# Patient Record
Sex: Male | Born: 1935 | Race: White | Hispanic: No | Marital: Married | Smoking: Current every day smoker
Health system: Southern US, Community
[De-identification: ages and names within clinical notes are randomized; demographics above are authoritative.]

## PROBLEM LIST (undated history)

## (undated) DIAGNOSIS — N400 Enlarged prostate without lower urinary tract symptoms: Secondary | ICD-10-CM

## (undated) DIAGNOSIS — K4090 Unilateral inguinal hernia, without obstruction or gangrene, not specified as recurrent: Secondary | ICD-10-CM

## (undated) DIAGNOSIS — N200 Calculus of kidney: Secondary | ICD-10-CM

## (undated) DIAGNOSIS — S065X9A Traumatic subdural hemorrhage with loss of consciousness of unspecified duration, initial encounter: Secondary | ICD-10-CM

## (undated) DIAGNOSIS — S32020A Wedge compression fracture of second lumbar vertebra, initial encounter for closed fracture: Secondary | ICD-10-CM

## (undated) DIAGNOSIS — M1990 Unspecified osteoarthritis, unspecified site: Secondary | ICD-10-CM

## (undated) DIAGNOSIS — M479 Spondylosis, unspecified: Secondary | ICD-10-CM

## (undated) DIAGNOSIS — H269 Unspecified cataract: Secondary | ICD-10-CM

---

## 2000-10-26 ENCOUNTER — Encounter: Payer: Self-pay | Admitting: Emergency Medicine

## 2000-10-27 ENCOUNTER — Inpatient Hospital Stay (HOSPITAL_COMMUNITY): Admission: EM | Admit: 2000-10-27 | Discharge: 2000-10-31 | Payer: Self-pay | Admitting: Emergency Medicine

## 2000-10-29 ENCOUNTER — Encounter: Payer: Self-pay | Admitting: Internal Medicine

## 2000-11-06 ENCOUNTER — Ambulatory Visit: Admission: RE | Admit: 2000-11-06 | Discharge: 2000-11-06 | Payer: Self-pay | Admitting: Internal Medicine

## 2000-11-26 ENCOUNTER — Ambulatory Visit (HOSPITAL_COMMUNITY): Admission: RE | Admit: 2000-11-26 | Discharge: 2000-11-26 | Payer: Self-pay | Admitting: Internal Medicine

## 2000-11-26 ENCOUNTER — Encounter: Payer: Self-pay | Admitting: Internal Medicine

## 2001-01-14 ENCOUNTER — Encounter: Payer: Self-pay | Admitting: Internal Medicine

## 2001-01-14 ENCOUNTER — Ambulatory Visit (HOSPITAL_COMMUNITY): Admission: RE | Admit: 2001-01-14 | Discharge: 2001-01-14 | Payer: Self-pay | Admitting: Internal Medicine

## 2004-08-02 ENCOUNTER — Emergency Department (HOSPITAL_COMMUNITY): Admission: EM | Admit: 2004-08-02 | Discharge: 2004-08-02 | Payer: Self-pay | Admitting: Emergency Medicine

## 2004-12-09 ENCOUNTER — Encounter: Admission: RE | Admit: 2004-12-09 | Discharge: 2004-12-09 | Payer: Self-pay | Admitting: Orthopaedic Surgery

## 2004-12-23 ENCOUNTER — Encounter: Admission: RE | Admit: 2004-12-23 | Discharge: 2004-12-23 | Payer: Self-pay | Admitting: Orthopaedic Surgery

## 2005-01-05 ENCOUNTER — Encounter: Admission: RE | Admit: 2005-01-05 | Discharge: 2005-01-05 | Payer: Self-pay | Admitting: Orthopaedic Surgery

## 2005-01-18 ENCOUNTER — Ambulatory Visit (HOSPITAL_COMMUNITY): Admission: RE | Admit: 2005-01-18 | Discharge: 2005-01-18 | Payer: Self-pay | Admitting: Neurology

## 2005-01-26 ENCOUNTER — Ambulatory Visit (HOSPITAL_COMMUNITY): Admission: RE | Admit: 2005-01-26 | Discharge: 2005-01-26 | Payer: Self-pay | Admitting: Neurology

## 2005-03-31 ENCOUNTER — Ambulatory Visit (HOSPITAL_COMMUNITY): Admission: RE | Admit: 2005-03-31 | Discharge: 2005-03-31 | Payer: Self-pay | Admitting: Neurology

## 2005-08-28 ENCOUNTER — Encounter: Admission: RE | Admit: 2005-08-28 | Discharge: 2005-08-28 | Payer: Self-pay | Admitting: Orthopaedic Surgery

## 2005-09-08 ENCOUNTER — Encounter: Admission: RE | Admit: 2005-09-08 | Discharge: 2005-09-08 | Payer: Self-pay | Admitting: Orthopaedic Surgery

## 2005-09-21 ENCOUNTER — Encounter: Admission: RE | Admit: 2005-09-21 | Discharge: 2005-09-21 | Payer: Self-pay | Admitting: Orthopaedic Surgery

## 2005-10-10 ENCOUNTER — Ambulatory Visit (HOSPITAL_COMMUNITY): Admission: RE | Admit: 2005-10-10 | Discharge: 2005-10-10 | Payer: Self-pay | Admitting: Internal Medicine

## 2005-10-13 ENCOUNTER — Encounter: Admission: RE | Admit: 2005-10-13 | Discharge: 2005-10-13 | Payer: Self-pay | Admitting: Internal Medicine

## 2005-12-27 ENCOUNTER — Ambulatory Visit (HOSPITAL_COMMUNITY): Admission: RE | Admit: 2005-12-27 | Discharge: 2005-12-27 | Payer: Self-pay | Admitting: Internal Medicine

## 2005-12-27 ENCOUNTER — Encounter: Payer: Self-pay | Admitting: Cardiology

## 2005-12-27 ENCOUNTER — Ambulatory Visit: Payer: Self-pay | Admitting: Cardiology

## 2007-05-21 ENCOUNTER — Encounter: Admission: RE | Admit: 2007-05-21 | Discharge: 2007-05-21 | Payer: Self-pay | Admitting: Internal Medicine

## 2009-05-07 ENCOUNTER — Encounter: Admission: RE | Admit: 2009-05-07 | Discharge: 2009-07-29 | Payer: Self-pay | Admitting: Physician Assistant

## 2011-04-14 NOTE — Discharge Summary (Signed)
[facility]  Patient:    [name], Mitchell                       MRN: [id number] Adm. Date:  [id number] Disc. Date: [id number] Attending:  Alva Garnet.                           Discharge Summary  DISCHARGE DIAGNOSES: 1.  Pneumonia. 2.  Fever. 3.  Mental status decline. 4.  Debility.  HOSPITAL COURSE:  Mitchell [name] was admitted from the [facility] emergency room with fever and mental status changes.  He had what appeared to be an acute episode where he suddenly was found in a state where he would not walk, had trouble walking up and down steps, and also had confusion.  He stated that his legs "could not work."  Initial chest x-ray revealed a right infrahilar rounded opacity which most likely represented a pneumonia or mass, however, follow-up CT scan did reveal bilateral lower lobe infiltrates which was more pronounced on the right and it was thought to be consistent with pneumonia. Head CT showed moderate atrophy but no acute intracranial findings and he had no other acute neurological symptoms.  A neurology consultation was obtained in the emergency room per Dr. Thad Ranger who felt that his neurological status was most likely secondary to an infectious cause rather than a neurological cause.  He had no other concerns including, shortness of breath, chest pain, headache, myalgia, or arthralgia.  He was started on IV antibiotics and over the course of his hospitalization his mental status returned to baseline.  He also ambulated without assistance and his appetite improved as well. He was treated with Maxipime and Zithromax.  DISPOSITION UPON DISCHARGE:  Mitchell [name] is ambulating, eating without complications.  His respiratory status is stable with O2 saturations in the high 90s.  He continues to spike temperatures at night, however, he defervesced with Tylenol.  He has no cough or shortness of breath.  He is also returned to his baseline mental  status.  DISCHARGE MEDICATIONS:  Suprax, Biaxin, Tylenol.  He will complete a 14 day antibiotic course.  FOLLOW-UP:  Mitchell [name] will be seen by Dr. Andi Devon on Tuesday, December 11 at 10 a.m.  He will require follow-up chest x-ray and/or CT scan in six weeks. DD:  10/30/00 TD:  10/31/00 Job: [id number]

## 2011-04-14 NOTE — Op Note (Signed)
NAMEENOC, GETTER NO.:  [id number]   MEDICAL RECORD NO.:  [id number]          PATIENT TYPE:  OUT   LOCATION:  [facility]                          FACILITY:  [facility]   PHYSICIAN:  Genene Churn. Love, M.D.    DATE OF BIRTH:  Aug 04, 1925   DATE OF PROCEDURE:  03/31/2005  DATE OF DISCHARGE:                                 OPERATIVE REPORT   CLINICAL INFORMATION:  This patient has a history of gait disorder and  positive LP test with drainage of CSF for suspected normal pressure  hydrocephalus and is being reevaluated for the same diagnosis.   PROCEDURE NOTE:  The patient was prepped and draped in the usual manner with  Betadine and 1% Xylocaine. The L4-L5 interspace was entered without  difficulty. Opening pressure was 140 mm of water, and clear, colorless CSF  was removed; 57 cc total was removed. The needle was kept in approximately  31 minutes. The patient performed a timed stand/walk/sit test pre-LP with  two chairs 20 feet apart in 1 minute and 40 seconds. He then performed a  stand/walk/sit test post-LP with chairs 20 steps apart which was 1 minute  and 30 seconds.   IMPRESSION:  This is a possible positive LP drainage procedure and not as  impressive as the first procedure that was performed approximately six weeks  ago.      JML/MEDQ  D:  03/31/2005  T:  03/31/2005  Job:  [id number]   cc:   Casilda Carls  [facility] [place]  [place] [address]  Fax: [phone]   Claude Manges. Cleophas Dunker, M.D.  [address]  [place] [address]  Fax: [phone]

## 2011-04-14 NOTE — H&P (Signed)
[facility]  Patient:    Mitchell [name], Mitchell [name]                         MRN: [id number] Adm. Date:  10/27/00 Attending:  Merlene Laughter. Renae Gloss, M.D.                         History and Physical  CHIEF COMPLAINT:  Altered mental status, pneumonia.  HISTORY OF PRESENT ILLNESS:  Mitchell [name] is a 77 year old gentleman in his usual state of health until 24 hours ago when his wife noted an acute decline in mental status consisting of confusion and disorientation, and difficulty walking.  Mitchell [name] stated that he could not walk and was unable to ambulate in the home.  He had been treated for URI/bronchitiis last week at an urgent care with avelox, however, appeared to have some confusion with that medication which was apparently discontinued and several days ago he was started on Augmentin.  He continues to be febrile.  He denied chest pain, shortness of breath, GI/GU symptoms.  He has acute constitutional specific complaints at this time.  ALLERGIES:  No known drug allergies, however, he may have a questionable reaction to avelox.  MEDICATIONS:  Augmentin.  PAST MEDICAL HISTORY:  None.  FAMILY HISTORY:  Unknown cancer in father who died at age 79.  SOCIAL HISTORY:  Mitchell [name] is married and retired.  He quit smoking 30 years ago.  He denies alcohol or drugs of abuse.  REVIEW OF SYSTEMS:  Negative except as per HPI.  Greater than 10 systems were reviewed.  PHYSICAL EXAMINATION:  GENERAL APPEARANCE:  Well-developed, well-nourished white male in no acute distress.  VITAL SIGNS:  Blood pressure 126/72, pulse 110, respiratory 24.  Temperature 100.1.  O2 saturations on room air 94%.  HEENT:  Dry mucous membranes.  No OP lesions.  PERRLA, EOMI.  NECK:  Supple.  No masses.  2+ carotids, no bruits.  LUNGS:  Decreased breath sounds in the bases bilaterally, otherwise clear to auscultation.  HEART:  S1, S2.  Regular rate and rhythm.  No murmurs, rubs or  gallops.  ABDOMEN:  Soft, nontender, nondistended.  Positive bowel sounds.  EXTREMITIES:  No clubbing, cyanosis, or edema.  2+ pulses throughout.  NEUROLOGIC:  Alert and oriented x 3.  Cranial nerves intact.  ASSESSMENT AND PLAN: 1. Altered mental status, gait disorder.  A neurology consult was obtained    earlier this morning which was unremarkable, and no further neurology    recommendations were given/needed at this time as it is thought that his    symptoms are not secondary to stroke.  His CT scan was unremarkable and    showed no acute abnormality.  His gait was not assessed in the ER, however,    he does have normal strength in his lower extremities with my exam this    morning. 2. Pneumonia:  Mitchell [name] appears to have community-aquired pneumonia requiring    IV antibiotics as he has apparently failed outpatient treatment.  It is    most likely that his pneumonia is the cause of his altered mental status as    well as his difficulty walking secondary to weakness and dehydration.  He    will be admitted for further observation/evaluation on IV antibiotics.    Blood cultures are pending at this time. DD:  10/27/00 TD:  10/27/00 Job: [id number]

## 2011-04-14 NOTE — Consult Note (Signed)
[facility]  Patient:    Mitchell [name], Mitchell [name]                       MRN: [id number] Proc. Date: 10/27/00 Adm. Date:  [id number] Disc. Date: [id number] Attending:  Alva Garnet.                          Consultation Report  DATE OF BIRTH:  27-Aug-1925.  REFERRING PHYSICIAN:  [facility].  REASON FOR EVALUATION:  77 years: Confusion and gait problems.  HISTORY OF PRESENT ILLNESS:  This is the initial emergency room consultation evaluation of this 77 year old man with no chronic medical problems who was brought into the emergency room with acute neurological change.  Patient is unable to give any history.  His wife says that he seemed okay until about 5 p.m. tonight, when he just suddenly was found in a state where he "would not walk."  His wife reports that she took his hand and walked him to the dinner table and that he took "baby steps" the whole way.  He ate uneventfully, but he seemed to have trouble walking up and down steps.  Per his wifes report, he has also been confused since that time.  His wife says his "legs just wouldnt work." There were no left/right focalities to this, no problems with his arms, no bowel or bladder problems, no dysarthria or dysphagia.  Per report, he saw his primary physician on Tuesday and was diagnosed with sinusitis and was treated with Avelox.  He took one dose and subsequently became confused and disoriented, went back and this was changed to Augmentin. His mental status had been better until this evening.  His wife denies any previous problems with mental status and indicates that he keeps his check book, does his taxes and drives without problems.  PAST MEDICAL HISTORY:  He denies chronic medical problems.  FAMILY MEDICAL HISTORY:  His mother had Alzheimers disease.  SOCIAL HISTORY:  No tobacco use.  Lives with his wife.  ALLERGIES:  No known drug allergies.  MEDICATION:  Augmentin.  REVIEW OF  SYSTEMS:  Negative except as above.  PHYSICAL EXAMINATION  VITAL SIGNS:  Temperature 100.1, blood pressure 136/69, pulse 114, respirations 16.  GENERAL:  He is alert and in no respiratory distress.  NECK:  Supple without bruit.  CHEST:  Clear to auscultation.  HEART:  Tachycardic and regular.  NEUROLOGIC:  Mental status:  He is alert but confused.  He alerts to vigorous verbal and tactile stimuli and can answer simple questions but starts failing to respond after a few questions and seems to drift off.  He concentrates poorly.  He is unable to relate any history.  He is occasionally oriented to place, [facility], but is not oriented to time.  Cranial nerves:  He is uncooperative with funduscopic and visual field examination.  Pupils are equal and briskly reactive.  Extraocular movements are grossly full.  Face is symmetric and tongue and palate move well.  Motor exam could not be tested formally, although his tone seems normal and he has at least antigravity strength in all extremities.  Sensation:  He withdraws all four extremities vigorously to pain.  Cerebellar:  Finger-to-nose is performed reasonably well. He does have a coarse tremor of the upper extremities, which seems to be related to rigors.  Gait exam is deferred.  Deep tendon reflexes are 1+ in the  upper extremities and 2+ in the lower extremities.  Toes are downgoing.  LABORATORY EXAMINATION:  CBC:  White cells 3.8, hemoglobin 14.9, platelets 137,000, with increased granulocytes.  CMET is normal except for mildly elevated AST and ALT of 58 and 51, respectively.  Coagulations are normal. Urinalysis is normal.  IMAGING STUDY:  CT of the head reveals no acute changes, with moderate atrophy and increased size of the ventricles appropriate to atrophy.  IMPRESSION:  77 man with toxic encephalopathy secondary to infection.  RECOMMENDATIONS 1. Infectious workup including chest x-ray and possibly  blood cultures. 2. Would defer any further neurological workup at this time until his    infectious problem has cleared and then, at that time, can reassess as    necessary. DD:  10/27/00 TD:  10/27/00 Job: [id number]

## 2011-11-29 DIAGNOSIS — N3941 Urge incontinence: Secondary | ICD-10-CM | POA: Diagnosis not present

## 2011-12-20 DIAGNOSIS — N3941 Urge incontinence: Secondary | ICD-10-CM | POA: Diagnosis not present

## 2012-01-18 DIAGNOSIS — R350 Frequency of micturition: Secondary | ICD-10-CM | POA: Diagnosis not present

## 2012-01-18 DIAGNOSIS — N3941 Urge incontinence: Secondary | ICD-10-CM | POA: Diagnosis not present

## 2012-02-07 DIAGNOSIS — N3941 Urge incontinence: Secondary | ICD-10-CM | POA: Diagnosis not present

## 2012-02-07 DIAGNOSIS — R350 Frequency of micturition: Secondary | ICD-10-CM | POA: Diagnosis not present

## 2012-03-06 DIAGNOSIS — N3941 Urge incontinence: Secondary | ICD-10-CM | POA: Diagnosis not present

## 2012-03-27 DIAGNOSIS — N3941 Urge incontinence: Secondary | ICD-10-CM | POA: Diagnosis not present

## 2012-03-27 DIAGNOSIS — R350 Frequency of micturition: Secondary | ICD-10-CM | POA: Diagnosis not present

## 2012-04-17 DIAGNOSIS — N3941 Urge incontinence: Secondary | ICD-10-CM | POA: Diagnosis not present

## 2012-04-17 DIAGNOSIS — R350 Frequency of micturition: Secondary | ICD-10-CM | POA: Diagnosis not present

## 2012-05-08 DIAGNOSIS — N3941 Urge incontinence: Secondary | ICD-10-CM | POA: Diagnosis not present

## 2012-05-29 DIAGNOSIS — N3941 Urge incontinence: Secondary | ICD-10-CM | POA: Diagnosis not present

## 2012-06-19 DIAGNOSIS — N3941 Urge incontinence: Secondary | ICD-10-CM | POA: Diagnosis not present

## 2012-07-08 DIAGNOSIS — R350 Frequency of micturition: Secondary | ICD-10-CM | POA: Diagnosis not present

## 2012-07-08 DIAGNOSIS — N3941 Urge incontinence: Secondary | ICD-10-CM | POA: Diagnosis not present

## 2012-07-31 DIAGNOSIS — R350 Frequency of micturition: Secondary | ICD-10-CM | POA: Diagnosis not present

## 2012-07-31 DIAGNOSIS — N3941 Urge incontinence: Secondary | ICD-10-CM | POA: Diagnosis not present

## 2012-08-21 DIAGNOSIS — R350 Frequency of micturition: Secondary | ICD-10-CM | POA: Diagnosis not present

## 2012-08-21 DIAGNOSIS — N3941 Urge incontinence: Secondary | ICD-10-CM | POA: Diagnosis not present

## 2012-09-11 DIAGNOSIS — R350 Frequency of micturition: Secondary | ICD-10-CM | POA: Diagnosis not present

## 2012-09-11 DIAGNOSIS — N3941 Urge incontinence: Secondary | ICD-10-CM | POA: Diagnosis not present

## 2012-10-02 DIAGNOSIS — N3941 Urge incontinence: Secondary | ICD-10-CM | POA: Diagnosis not present

## 2012-10-02 DIAGNOSIS — R350 Frequency of micturition: Secondary | ICD-10-CM | POA: Diagnosis not present

## 2012-10-21 DIAGNOSIS — L821 Other seborrheic keratosis: Secondary | ICD-10-CM | POA: Diagnosis not present

## 2012-10-21 DIAGNOSIS — Z85828 Personal history of other malignant neoplasm of skin: Secondary | ICD-10-CM | POA: Diagnosis not present

## 2012-10-21 DIAGNOSIS — L570 Actinic keratosis: Secondary | ICD-10-CM | POA: Diagnosis not present

## 2012-10-23 DIAGNOSIS — N3941 Urge incontinence: Secondary | ICD-10-CM | POA: Diagnosis not present

## 2012-10-23 DIAGNOSIS — R350 Frequency of micturition: Secondary | ICD-10-CM | POA: Diagnosis not present

## 2012-10-31 DIAGNOSIS — Z23 Encounter for immunization: Secondary | ICD-10-CM | POA: Diagnosis not present

## 2012-11-08 DIAGNOSIS — R351 Nocturia: Secondary | ICD-10-CM | POA: Diagnosis not present

## 2012-11-08 DIAGNOSIS — N3941 Urge incontinence: Secondary | ICD-10-CM | POA: Diagnosis not present

## 2012-11-13 DIAGNOSIS — N3941 Urge incontinence: Secondary | ICD-10-CM | POA: Diagnosis not present

## 2012-11-13 DIAGNOSIS — R350 Frequency of micturition: Secondary | ICD-10-CM | POA: Diagnosis not present

## 2012-12-04 DIAGNOSIS — N3941 Urge incontinence: Secondary | ICD-10-CM | POA: Diagnosis not present

## 2012-12-04 DIAGNOSIS — R350 Frequency of micturition: Secondary | ICD-10-CM | POA: Diagnosis not present

## 2012-12-06 DIAGNOSIS — M810 Age-related osteoporosis without current pathological fracture: Secondary | ICD-10-CM | POA: Diagnosis not present

## 2012-12-06 DIAGNOSIS — R82998 Other abnormal findings in urine: Secondary | ICD-10-CM | POA: Diagnosis not present

## 2012-12-06 DIAGNOSIS — Z125 Encounter for screening for malignant neoplasm of prostate: Secondary | ICD-10-CM | POA: Diagnosis not present

## 2012-12-06 DIAGNOSIS — F329 Major depressive disorder, single episode, unspecified: Secondary | ICD-10-CM | POA: Diagnosis not present

## 2012-12-06 DIAGNOSIS — Z79899 Other long term (current) drug therapy: Secondary | ICD-10-CM | POA: Diagnosis not present

## 2012-12-13 DIAGNOSIS — R32 Unspecified urinary incontinence: Secondary | ICD-10-CM | POA: Diagnosis not present

## 2012-12-13 DIAGNOSIS — Z0000 Encounter for general adult medical examination without abnormal findings: Secondary | ICD-10-CM | POA: Diagnosis not present

## 2012-12-13 DIAGNOSIS — Z79899 Other long term (current) drug therapy: Secondary | ICD-10-CM | POA: Diagnosis not present

## 2012-12-13 DIAGNOSIS — Z1331 Encounter for screening for depression: Secondary | ICD-10-CM | POA: Diagnosis not present

## 2012-12-13 DIAGNOSIS — M810 Age-related osteoporosis without current pathological fracture: Secondary | ICD-10-CM | POA: Diagnosis not present

## 2012-12-26 DIAGNOSIS — Z982 Presence of cerebrospinal fluid drainage device: Secondary | ICD-10-CM | POA: Diagnosis not present

## 2012-12-26 DIAGNOSIS — G912 (Idiopathic) normal pressure hydrocephalus: Secondary | ICD-10-CM | POA: Diagnosis not present

## 2012-12-30 DIAGNOSIS — G894 Chronic pain syndrome: Secondary | ICD-10-CM | POA: Diagnosis not present

## 2013-01-01 DIAGNOSIS — R350 Frequency of micturition: Secondary | ICD-10-CM | POA: Diagnosis not present

## 2013-01-01 DIAGNOSIS — N3941 Urge incontinence: Secondary | ICD-10-CM | POA: Diagnosis not present

## 2013-01-06 DIAGNOSIS — G894 Chronic pain syndrome: Secondary | ICD-10-CM | POA: Diagnosis not present

## 2013-01-13 DIAGNOSIS — G894 Chronic pain syndrome: Secondary | ICD-10-CM | POA: Diagnosis not present

## 2013-01-17 DIAGNOSIS — G894 Chronic pain syndrome: Secondary | ICD-10-CM | POA: Diagnosis not present

## 2013-01-20 DIAGNOSIS — G894 Chronic pain syndrome: Secondary | ICD-10-CM | POA: Diagnosis not present

## 2013-01-22 DIAGNOSIS — N3941 Urge incontinence: Secondary | ICD-10-CM | POA: Diagnosis not present

## 2013-01-22 DIAGNOSIS — R350 Frequency of micturition: Secondary | ICD-10-CM | POA: Diagnosis not present

## 2013-01-24 DIAGNOSIS — M5137 Other intervertebral disc degeneration, lumbosacral region: Secondary | ICD-10-CM | POA: Diagnosis not present

## 2013-01-27 DIAGNOSIS — M5137 Other intervertebral disc degeneration, lumbosacral region: Secondary | ICD-10-CM | POA: Diagnosis not present

## 2013-02-03 DIAGNOSIS — G894 Chronic pain syndrome: Secondary | ICD-10-CM | POA: Diagnosis not present

## 2013-02-07 DIAGNOSIS — G894 Chronic pain syndrome: Secondary | ICD-10-CM | POA: Diagnosis not present

## 2013-02-11 DIAGNOSIS — G912 (Idiopathic) normal pressure hydrocephalus: Secondary | ICD-10-CM | POA: Diagnosis not present

## 2013-02-12 DIAGNOSIS — N3941 Urge incontinence: Secondary | ICD-10-CM | POA: Diagnosis not present

## 2013-02-14 DIAGNOSIS — G894 Chronic pain syndrome: Secondary | ICD-10-CM | POA: Diagnosis not present

## 2013-02-20 DIAGNOSIS — M1710 Unilateral primary osteoarthritis, unspecified knee: Secondary | ICD-10-CM | POA: Diagnosis not present

## 2013-03-05 DIAGNOSIS — R350 Frequency of micturition: Secondary | ICD-10-CM | POA: Diagnosis not present

## 2013-03-05 DIAGNOSIS — N3941 Urge incontinence: Secondary | ICD-10-CM | POA: Diagnosis not present

## 2013-03-27 DIAGNOSIS — N3941 Urge incontinence: Secondary | ICD-10-CM | POA: Diagnosis not present

## 2013-04-16 DIAGNOSIS — R350 Frequency of micturition: Secondary | ICD-10-CM | POA: Diagnosis not present

## 2013-04-16 DIAGNOSIS — N3941 Urge incontinence: Secondary | ICD-10-CM | POA: Diagnosis not present

## 2013-05-13 DIAGNOSIS — R350 Frequency of micturition: Secondary | ICD-10-CM | POA: Diagnosis not present

## 2013-05-13 DIAGNOSIS — N3941 Urge incontinence: Secondary | ICD-10-CM | POA: Diagnosis not present

## 2013-05-20 DIAGNOSIS — L600 Ingrowing nail: Secondary | ICD-10-CM | POA: Diagnosis not present

## 2013-05-20 DIAGNOSIS — M79609 Pain in unspecified limb: Secondary | ICD-10-CM | POA: Diagnosis not present

## 2013-05-20 DIAGNOSIS — B351 Tinea unguium: Secondary | ICD-10-CM | POA: Diagnosis not present

## 2013-06-11 DIAGNOSIS — R350 Frequency of micturition: Secondary | ICD-10-CM | POA: Diagnosis not present

## 2013-06-11 DIAGNOSIS — N3941 Urge incontinence: Secondary | ICD-10-CM | POA: Diagnosis not present

## 2013-06-20 DIAGNOSIS — L03019 Cellulitis of unspecified finger: Secondary | ICD-10-CM | POA: Diagnosis not present

## 2013-06-24 DIAGNOSIS — C4442 Squamous cell carcinoma of skin of scalp and neck: Secondary | ICD-10-CM | POA: Diagnosis not present

## 2013-06-24 DIAGNOSIS — D485 Neoplasm of uncertain behavior of skin: Secondary | ICD-10-CM | POA: Diagnosis not present

## 2013-07-11 DIAGNOSIS — R350 Frequency of micturition: Secondary | ICD-10-CM | POA: Diagnosis not present

## 2013-07-11 DIAGNOSIS — N3941 Urge incontinence: Secondary | ICD-10-CM | POA: Diagnosis not present

## 2013-07-16 DIAGNOSIS — C4492 Squamous cell carcinoma of skin, unspecified: Secondary | ICD-10-CM | POA: Diagnosis not present

## 2013-07-30 DIAGNOSIS — N3941 Urge incontinence: Secondary | ICD-10-CM | POA: Diagnosis not present

## 2013-07-30 DIAGNOSIS — R350 Frequency of micturition: Secondary | ICD-10-CM | POA: Diagnosis not present

## 2013-08-20 DIAGNOSIS — N3941 Urge incontinence: Secondary | ICD-10-CM | POA: Diagnosis not present

## 2013-08-20 DIAGNOSIS — R350 Frequency of micturition: Secondary | ICD-10-CM | POA: Diagnosis not present

## 2013-08-25 DIAGNOSIS — M79609 Pain in unspecified limb: Secondary | ICD-10-CM | POA: Diagnosis not present

## 2013-08-25 DIAGNOSIS — B351 Tinea unguium: Secondary | ICD-10-CM | POA: Diagnosis not present

## 2013-09-10 DIAGNOSIS — R3915 Urgency of urination: Secondary | ICD-10-CM | POA: Diagnosis not present

## 2013-10-08 DIAGNOSIS — N3941 Urge incontinence: Secondary | ICD-10-CM | POA: Diagnosis not present

## 2013-10-09 DIAGNOSIS — Z23 Encounter for immunization: Secondary | ICD-10-CM | POA: Diagnosis not present

## 2013-10-29 DIAGNOSIS — N3941 Urge incontinence: Secondary | ICD-10-CM | POA: Diagnosis not present

## 2013-11-17 DIAGNOSIS — B351 Tinea unguium: Secondary | ICD-10-CM | POA: Diagnosis not present

## 2013-11-17 DIAGNOSIS — L608 Other nail disorders: Secondary | ICD-10-CM | POA: Diagnosis not present

## 2013-11-17 DIAGNOSIS — I739 Peripheral vascular disease, unspecified: Secondary | ICD-10-CM | POA: Diagnosis not present

## 2013-11-17 DIAGNOSIS — M79609 Pain in unspecified limb: Secondary | ICD-10-CM | POA: Diagnosis not present

## 2013-11-26 DIAGNOSIS — N3941 Urge incontinence: Secondary | ICD-10-CM | POA: Diagnosis not present

## 2013-12-12 DIAGNOSIS — R82998 Other abnormal findings in urine: Secondary | ICD-10-CM | POA: Diagnosis not present

## 2013-12-12 DIAGNOSIS — Z125 Encounter for screening for malignant neoplasm of prostate: Secondary | ICD-10-CM | POA: Diagnosis not present

## 2013-12-12 DIAGNOSIS — M810 Age-related osteoporosis without current pathological fracture: Secondary | ICD-10-CM | POA: Diagnosis not present

## 2013-12-12 DIAGNOSIS — Z79899 Other long term (current) drug therapy: Secondary | ICD-10-CM | POA: Diagnosis not present

## 2013-12-31 DIAGNOSIS — R350 Frequency of micturition: Secondary | ICD-10-CM | POA: Diagnosis not present

## 2013-12-31 DIAGNOSIS — N3941 Urge incontinence: Secondary | ICD-10-CM | POA: Diagnosis not present

## 2014-01-09 DIAGNOSIS — G910 Communicating hydrocephalus: Secondary | ICD-10-CM | POA: Diagnosis not present

## 2014-01-09 DIAGNOSIS — R32 Unspecified urinary incontinence: Secondary | ICD-10-CM | POA: Diagnosis not present

## 2014-01-09 DIAGNOSIS — M1990 Unspecified osteoarthritis, unspecified site: Secondary | ICD-10-CM | POA: Diagnosis not present

## 2014-01-09 DIAGNOSIS — Z6829 Body mass index (BMI) 29.0-29.9, adult: Secondary | ICD-10-CM | POA: Diagnosis not present

## 2014-01-09 DIAGNOSIS — Z0000 Encounter for general adult medical examination without abnormal findings: Secondary | ICD-10-CM | POA: Diagnosis not present

## 2014-01-09 DIAGNOSIS — M810 Age-related osteoporosis without current pathological fracture: Secondary | ICD-10-CM | POA: Diagnosis not present

## 2014-01-09 DIAGNOSIS — Z1331 Encounter for screening for depression: Secondary | ICD-10-CM | POA: Diagnosis not present

## 2014-01-28 DIAGNOSIS — N3941 Urge incontinence: Secondary | ICD-10-CM | POA: Diagnosis not present

## 2014-02-18 DIAGNOSIS — N400 Enlarged prostate without lower urinary tract symptoms: Secondary | ICD-10-CM | POA: Diagnosis not present

## 2014-02-18 DIAGNOSIS — R351 Nocturia: Secondary | ICD-10-CM | POA: Diagnosis not present

## 2014-02-25 DIAGNOSIS — R350 Frequency of micturition: Secondary | ICD-10-CM | POA: Diagnosis not present

## 2014-02-25 DIAGNOSIS — N3941 Urge incontinence: Secondary | ICD-10-CM | POA: Diagnosis not present

## 2014-03-25 DIAGNOSIS — R350 Frequency of micturition: Secondary | ICD-10-CM | POA: Diagnosis not present

## 2014-03-25 DIAGNOSIS — N3941 Urge incontinence: Secondary | ICD-10-CM | POA: Diagnosis not present

## 2014-04-22 DIAGNOSIS — N3941 Urge incontinence: Secondary | ICD-10-CM | POA: Diagnosis not present

## 2014-04-22 DIAGNOSIS — R350 Frequency of micturition: Secondary | ICD-10-CM | POA: Diagnosis not present

## 2014-05-20 DIAGNOSIS — R350 Frequency of micturition: Secondary | ICD-10-CM | POA: Diagnosis not present

## 2014-05-20 DIAGNOSIS — N3941 Urge incontinence: Secondary | ICD-10-CM | POA: Diagnosis not present

## 2014-05-27 DIAGNOSIS — G912 (Idiopathic) normal pressure hydrocephalus: Secondary | ICD-10-CM | POA: Diagnosis not present

## 2014-06-25 DIAGNOSIS — R350 Frequency of micturition: Secondary | ICD-10-CM | POA: Diagnosis not present

## 2014-06-25 DIAGNOSIS — N3941 Urge incontinence: Secondary | ICD-10-CM | POA: Diagnosis not present

## 2014-07-29 DIAGNOSIS — N3941 Urge incontinence: Secondary | ICD-10-CM | POA: Diagnosis not present

## 2014-09-02 DIAGNOSIS — N3941 Urge incontinence: Secondary | ICD-10-CM | POA: Diagnosis not present

## 2014-09-02 DIAGNOSIS — R350 Frequency of micturition: Secondary | ICD-10-CM | POA: Diagnosis not present

## 2014-09-30 DIAGNOSIS — Z23 Encounter for immunization: Secondary | ICD-10-CM | POA: Diagnosis not present

## 2014-10-07 DIAGNOSIS — N3941 Urge incontinence: Secondary | ICD-10-CM | POA: Diagnosis not present

## 2014-10-07 DIAGNOSIS — R350 Frequency of micturition: Secondary | ICD-10-CM | POA: Diagnosis not present

## 2014-11-11 DIAGNOSIS — R350 Frequency of micturition: Secondary | ICD-10-CM | POA: Diagnosis not present

## 2014-11-11 DIAGNOSIS — N3941 Urge incontinence: Secondary | ICD-10-CM | POA: Diagnosis not present

## 2014-12-02 DIAGNOSIS — N3941 Urge incontinence: Secondary | ICD-10-CM | POA: Diagnosis not present

## 2014-12-30 DIAGNOSIS — N3941 Urge incontinence: Secondary | ICD-10-CM | POA: Diagnosis not present

## 2014-12-30 DIAGNOSIS — R350 Frequency of micturition: Secondary | ICD-10-CM | POA: Diagnosis not present

## 2015-01-20 DIAGNOSIS — M810 Age-related osteoporosis without current pathological fracture: Secondary | ICD-10-CM | POA: Diagnosis not present

## 2015-01-20 DIAGNOSIS — Z125 Encounter for screening for malignant neoplasm of prostate: Secondary | ICD-10-CM | POA: Diagnosis not present

## 2015-01-20 DIAGNOSIS — Z79899 Other long term (current) drug therapy: Secondary | ICD-10-CM | POA: Diagnosis not present

## 2015-01-21 DIAGNOSIS — R8290 Unspecified abnormal findings in urine: Secondary | ICD-10-CM | POA: Diagnosis not present

## 2015-01-21 DIAGNOSIS — R358 Other polyuria: Secondary | ICD-10-CM | POA: Diagnosis not present

## 2015-01-22 DIAGNOSIS — F329 Major depressive disorder, single episode, unspecified: Secondary | ICD-10-CM | POA: Diagnosis not present

## 2015-01-22 DIAGNOSIS — Z23 Encounter for immunization: Secondary | ICD-10-CM | POA: Diagnosis not present

## 2015-01-22 DIAGNOSIS — M1990 Unspecified osteoarthritis, unspecified site: Secondary | ICD-10-CM | POA: Diagnosis not present

## 2015-01-22 DIAGNOSIS — Z1389 Encounter for screening for other disorder: Secondary | ICD-10-CM | POA: Diagnosis not present

## 2015-01-22 DIAGNOSIS — Z0000 Encounter for general adult medical examination without abnormal findings: Secondary | ICD-10-CM | POA: Diagnosis not present

## 2015-01-22 DIAGNOSIS — Z6827 Body mass index (BMI) 27.0-27.9, adult: Secondary | ICD-10-CM | POA: Diagnosis not present

## 2015-01-22 DIAGNOSIS — M810 Age-related osteoporosis without current pathological fracture: Secondary | ICD-10-CM | POA: Diagnosis not present

## 2015-02-03 DIAGNOSIS — N3941 Urge incontinence: Secondary | ICD-10-CM | POA: Diagnosis not present

## 2015-02-03 DIAGNOSIS — R350 Frequency of micturition: Secondary | ICD-10-CM | POA: Diagnosis not present

## 2015-03-03 DIAGNOSIS — R350 Frequency of micturition: Secondary | ICD-10-CM | POA: Diagnosis not present

## 2015-03-03 DIAGNOSIS — N3941 Urge incontinence: Secondary | ICD-10-CM | POA: Diagnosis not present

## 2015-03-31 DIAGNOSIS — N3941 Urge incontinence: Secondary | ICD-10-CM | POA: Diagnosis not present

## 2015-04-28 DIAGNOSIS — N3941 Urge incontinence: Secondary | ICD-10-CM | POA: Diagnosis not present

## 2015-04-28 DIAGNOSIS — R350 Frequency of micturition: Secondary | ICD-10-CM | POA: Diagnosis not present

## 2015-05-26 DIAGNOSIS — N3941 Urge incontinence: Secondary | ICD-10-CM | POA: Diagnosis not present

## 2015-05-26 DIAGNOSIS — R350 Frequency of micturition: Secondary | ICD-10-CM | POA: Diagnosis not present

## 2015-06-15 DIAGNOSIS — G912 (Idiopathic) normal pressure hydrocephalus: Secondary | ICD-10-CM | POA: Diagnosis not present

## 2015-06-15 DIAGNOSIS — N401 Enlarged prostate with lower urinary tract symptoms: Secondary | ICD-10-CM | POA: Diagnosis not present

## 2015-06-15 DIAGNOSIS — N39498 Other specified urinary incontinence: Secondary | ICD-10-CM | POA: Diagnosis not present

## 2015-06-15 DIAGNOSIS — F329 Major depressive disorder, single episode, unspecified: Secondary | ICD-10-CM | POA: Diagnosis not present

## 2015-06-15 DIAGNOSIS — Z7982 Long term (current) use of aspirin: Secondary | ICD-10-CM | POA: Diagnosis not present

## 2015-06-15 DIAGNOSIS — Z982 Presence of cerebrospinal fluid drainage device: Secondary | ICD-10-CM | POA: Diagnosis not present

## 2015-06-29 DIAGNOSIS — R350 Frequency of micturition: Secondary | ICD-10-CM | POA: Diagnosis not present

## 2015-06-29 DIAGNOSIS — N3941 Urge incontinence: Secondary | ICD-10-CM | POA: Diagnosis not present

## 2015-07-28 DIAGNOSIS — N3941 Urge incontinence: Secondary | ICD-10-CM | POA: Diagnosis not present

## 2015-07-28 DIAGNOSIS — R350 Frequency of micturition: Secondary | ICD-10-CM | POA: Diagnosis not present

## 2015-08-25 DIAGNOSIS — N3941 Urge incontinence: Secondary | ICD-10-CM | POA: Diagnosis not present

## 2015-09-22 DIAGNOSIS — R350 Frequency of micturition: Secondary | ICD-10-CM | POA: Diagnosis not present

## 2015-09-22 DIAGNOSIS — N3941 Urge incontinence: Secondary | ICD-10-CM | POA: Diagnosis not present

## 2015-10-20 DIAGNOSIS — R350 Frequency of micturition: Secondary | ICD-10-CM | POA: Diagnosis not present

## 2015-10-20 DIAGNOSIS — N3941 Urge incontinence: Secondary | ICD-10-CM | POA: Diagnosis not present

## 2015-11-08 DIAGNOSIS — C4442 Squamous cell carcinoma of skin of scalp and neck: Secondary | ICD-10-CM | POA: Diagnosis not present

## 2015-11-08 DIAGNOSIS — D485 Neoplasm of uncertain behavior of skin: Secondary | ICD-10-CM | POA: Diagnosis not present

## 2015-11-08 DIAGNOSIS — L570 Actinic keratosis: Secondary | ICD-10-CM | POA: Diagnosis not present

## 2015-11-16 DIAGNOSIS — R3915 Urgency of urination: Secondary | ICD-10-CM | POA: Diagnosis not present

## 2015-11-29 DIAGNOSIS — Z23 Encounter for immunization: Secondary | ICD-10-CM | POA: Diagnosis not present

## 2015-12-01 DIAGNOSIS — C4442 Squamous cell carcinoma of skin of scalp and neck: Secondary | ICD-10-CM | POA: Diagnosis not present

## 2015-12-14 DIAGNOSIS — N3941 Urge incontinence: Secondary | ICD-10-CM | POA: Diagnosis not present

## 2015-12-14 DIAGNOSIS — R350 Frequency of micturition: Secondary | ICD-10-CM | POA: Diagnosis not present

## 2016-01-11 DIAGNOSIS — N3941 Urge incontinence: Secondary | ICD-10-CM | POA: Diagnosis not present

## 2016-01-11 DIAGNOSIS — R350 Frequency of micturition: Secondary | ICD-10-CM | POA: Diagnosis not present

## 2016-01-24 DIAGNOSIS — M810 Age-related osteoporosis without current pathological fracture: Secondary | ICD-10-CM | POA: Diagnosis not present

## 2016-01-24 DIAGNOSIS — Z79899 Other long term (current) drug therapy: Secondary | ICD-10-CM | POA: Diagnosis not present

## 2016-01-24 DIAGNOSIS — Z125 Encounter for screening for malignant neoplasm of prostate: Secondary | ICD-10-CM | POA: Diagnosis not present

## 2016-01-26 DIAGNOSIS — R32 Unspecified urinary incontinence: Secondary | ICD-10-CM | POA: Diagnosis not present

## 2016-01-26 DIAGNOSIS — M810 Age-related osteoporosis without current pathological fracture: Secondary | ICD-10-CM | POA: Diagnosis not present

## 2016-01-26 DIAGNOSIS — R627 Adult failure to thrive: Secondary | ICD-10-CM | POA: Diagnosis not present

## 2016-01-26 DIAGNOSIS — Z1389 Encounter for screening for other disorder: Secondary | ICD-10-CM | POA: Diagnosis not present

## 2016-01-26 DIAGNOSIS — F329 Major depressive disorder, single episode, unspecified: Secondary | ICD-10-CM | POA: Diagnosis not present

## 2016-01-26 DIAGNOSIS — M1990 Unspecified osteoarthritis, unspecified site: Secondary | ICD-10-CM | POA: Diagnosis not present

## 2016-01-26 DIAGNOSIS — F0390 Unspecified dementia without behavioral disturbance: Secondary | ICD-10-CM | POA: Diagnosis not present

## 2016-01-26 DIAGNOSIS — Z0000 Encounter for general adult medical examination without abnormal findings: Secondary | ICD-10-CM | POA: Diagnosis not present

## 2016-01-26 DIAGNOSIS — G912 (Idiopathic) normal pressure hydrocephalus: Secondary | ICD-10-CM | POA: Diagnosis not present

## 2016-01-26 DIAGNOSIS — Z6829 Body mass index (BMI) 29.0-29.9, adult: Secondary | ICD-10-CM | POA: Diagnosis not present

## 2016-02-15 DIAGNOSIS — R3915 Urgency of urination: Secondary | ICD-10-CM | POA: Diagnosis not present

## 2016-02-15 DIAGNOSIS — R350 Frequency of micturition: Secondary | ICD-10-CM | POA: Diagnosis not present

## 2016-03-03 DIAGNOSIS — Z85828 Personal history of other malignant neoplasm of skin: Secondary | ICD-10-CM | POA: Diagnosis not present

## 2016-03-03 DIAGNOSIS — L814 Other melanin hyperpigmentation: Secondary | ICD-10-CM | POA: Diagnosis not present

## 2016-03-03 DIAGNOSIS — C4442 Squamous cell carcinoma of skin of scalp and neck: Secondary | ICD-10-CM | POA: Diagnosis not present

## 2016-03-03 DIAGNOSIS — L821 Other seborrheic keratosis: Secondary | ICD-10-CM | POA: Diagnosis not present

## 2016-03-03 DIAGNOSIS — D485 Neoplasm of uncertain behavior of skin: Secondary | ICD-10-CM | POA: Diagnosis not present

## 2016-03-03 DIAGNOSIS — L570 Actinic keratosis: Secondary | ICD-10-CM | POA: Diagnosis not present

## 2016-03-07 DIAGNOSIS — R3915 Urgency of urination: Secondary | ICD-10-CM | POA: Diagnosis not present

## 2016-03-07 DIAGNOSIS — R350 Frequency of micturition: Secondary | ICD-10-CM | POA: Diagnosis not present

## 2016-03-15 ENCOUNTER — Ambulatory Visit (HOSPITAL_COMMUNITY)
Admission: RE | Admit: 2016-03-15 | Discharge: 2016-03-15 | Disposition: A | Discharge status: Home / Self Care | Payer: Medicare Other | Source: Ambulatory Visit | Attending: Vascular Surgery | Admitting: Vascular Surgery

## 2016-03-15 ENCOUNTER — Other Ambulatory Visit (HOSPITAL_COMMUNITY): Payer: Self-pay | Admitting: Registered Nurse

## 2016-03-15 DIAGNOSIS — M79604 Pain in right leg: Secondary | ICD-10-CM

## 2016-03-15 DIAGNOSIS — M79601 Pain in right arm: Secondary | ICD-10-CM | POA: Diagnosis not present

## 2016-03-15 DIAGNOSIS — M7989 Other specified soft tissue disorders: Secondary | ICD-10-CM | POA: Insufficient documentation

## 2016-03-15 DIAGNOSIS — Z6828 Body mass index (BMI) 28.0-28.9, adult: Secondary | ICD-10-CM | POA: Diagnosis not present

## 2016-06-14 DIAGNOSIS — G912 (Idiopathic) normal pressure hydrocephalus: Secondary | ICD-10-CM | POA: Diagnosis not present

## 2016-06-14 DIAGNOSIS — Z982 Presence of cerebrospinal fluid drainage device: Secondary | ICD-10-CM | POA: Diagnosis not present

## 2016-06-14 DIAGNOSIS — R32 Unspecified urinary incontinence: Secondary | ICD-10-CM | POA: Diagnosis not present

## 2016-06-14 DIAGNOSIS — Z79899 Other long term (current) drug therapy: Secondary | ICD-10-CM | POA: Diagnosis not present

## 2016-06-14 DIAGNOSIS — Z881 Allergy status to other antibiotic agents status: Secondary | ICD-10-CM | POA: Diagnosis not present

## 2016-06-14 DIAGNOSIS — Z9889 Other specified postprocedural states: Secondary | ICD-10-CM | POA: Diagnosis not present

## 2016-06-14 DIAGNOSIS — R269 Unspecified abnormalities of gait and mobility: Secondary | ICD-10-CM | POA: Diagnosis not present

## 2016-06-14 DIAGNOSIS — R413 Other amnesia: Secondary | ICD-10-CM | POA: Diagnosis not present

## 2016-07-03 DIAGNOSIS — Z4541 Encounter for adjustment and management of cerebrospinal fluid drainage device: Secondary | ICD-10-CM | POA: Diagnosis not present

## 2016-07-03 DIAGNOSIS — G912 (Idiopathic) normal pressure hydrocephalus: Secondary | ICD-10-CM | POA: Diagnosis not present

## 2016-10-07 DIAGNOSIS — Z23 Encounter for immunization: Secondary | ICD-10-CM | POA: Diagnosis not present

## 2017-01-29 DIAGNOSIS — M810 Age-related osteoporosis without current pathological fracture: Secondary | ICD-10-CM | POA: Diagnosis not present

## 2017-01-29 DIAGNOSIS — Z125 Encounter for screening for malignant neoplasm of prostate: Secondary | ICD-10-CM | POA: Diagnosis not present

## 2017-01-29 DIAGNOSIS — F329 Major depressive disorder, single episode, unspecified: Secondary | ICD-10-CM | POA: Diagnosis not present

## 2017-01-29 DIAGNOSIS — R32 Unspecified urinary incontinence: Secondary | ICD-10-CM | POA: Diagnosis not present

## 2017-01-29 DIAGNOSIS — R8290 Unspecified abnormal findings in urine: Secondary | ICD-10-CM | POA: Diagnosis not present

## 2017-01-29 DIAGNOSIS — Z79899 Other long term (current) drug therapy: Secondary | ICD-10-CM | POA: Diagnosis not present

## 2017-02-07 DIAGNOSIS — M810 Age-related osteoporosis without current pathological fracture: Secondary | ICD-10-CM | POA: Diagnosis not present

## 2017-02-07 DIAGNOSIS — M1990 Unspecified osteoarthritis, unspecified site: Secondary | ICD-10-CM | POA: Diagnosis not present

## 2017-02-07 DIAGNOSIS — R627 Adult failure to thrive: Secondary | ICD-10-CM | POA: Diagnosis not present

## 2017-02-07 DIAGNOSIS — Z6827 Body mass index (BMI) 27.0-27.9, adult: Secondary | ICD-10-CM | POA: Diagnosis not present

## 2017-02-07 DIAGNOSIS — R32 Unspecified urinary incontinence: Secondary | ICD-10-CM | POA: Diagnosis not present

## 2017-02-07 DIAGNOSIS — G912 (Idiopathic) normal pressure hydrocephalus: Secondary | ICD-10-CM | POA: Diagnosis not present

## 2017-02-07 DIAGNOSIS — F329 Major depressive disorder, single episode, unspecified: Secondary | ICD-10-CM | POA: Diagnosis not present

## 2017-02-07 DIAGNOSIS — Z1389 Encounter for screening for other disorder: Secondary | ICD-10-CM | POA: Diagnosis not present

## 2017-02-07 DIAGNOSIS — F0390 Unspecified dementia without behavioral disturbance: Secondary | ICD-10-CM | POA: Diagnosis not present

## 2017-02-07 DIAGNOSIS — Z23 Encounter for immunization: Secondary | ICD-10-CM | POA: Diagnosis not present

## 2017-02-07 DIAGNOSIS — Z0000 Encounter for general adult medical examination without abnormal findings: Secondary | ICD-10-CM | POA: Diagnosis not present

## 2017-02-07 DIAGNOSIS — N3281 Overactive bladder: Secondary | ICD-10-CM | POA: Diagnosis not present

## 2017-02-14 DIAGNOSIS — N390 Urinary tract infection, site not specified: Secondary | ICD-10-CM | POA: Diagnosis not present

## 2017-02-14 DIAGNOSIS — R8299 Other abnormal findings in urine: Secondary | ICD-10-CM | POA: Diagnosis not present

## 2017-02-28 DIAGNOSIS — R358 Other polyuria: Secondary | ICD-10-CM | POA: Diagnosis not present

## 2017-03-08 DIAGNOSIS — N3281 Overactive bladder: Secondary | ICD-10-CM | POA: Diagnosis not present

## 2017-03-08 DIAGNOSIS — R8290 Unspecified abnormal findings in urine: Secondary | ICD-10-CM | POA: Diagnosis not present

## 2017-05-03 DIAGNOSIS — R3915 Urgency of urination: Secondary | ICD-10-CM | POA: Diagnosis not present

## 2017-05-03 DIAGNOSIS — R3121 Asymptomatic microscopic hematuria: Secondary | ICD-10-CM | POA: Diagnosis not present

## 2017-05-03 DIAGNOSIS — N401 Enlarged prostate with lower urinary tract symptoms: Secondary | ICD-10-CM | POA: Diagnosis not present

## 2017-05-03 DIAGNOSIS — R350 Frequency of micturition: Secondary | ICD-10-CM | POA: Diagnosis not present

## 2017-07-07 ENCOUNTER — Emergency Department (HOSPITAL_COMMUNITY): Payer: Medicare Other

## 2017-07-07 ENCOUNTER — Inpatient Hospital Stay (HOSPITAL_COMMUNITY)
Admission: EM | Admit: 2017-07-07 | Discharge: 2017-07-10 | DRG: 086 | Disposition: A | Discharge status: Skilled Nursing Facility | Payer: Medicare Other | Attending: Family Medicine | Admitting: Family Medicine

## 2017-07-07 ENCOUNTER — Encounter (HOSPITAL_COMMUNITY): Payer: Self-pay | Admitting: Nurse Practitioner

## 2017-07-07 DIAGNOSIS — Z23 Encounter for immunization: Secondary | ICD-10-CM | POA: Diagnosis not present

## 2017-07-07 DIAGNOSIS — R402412 Glasgow coma scale score 13-15, at arrival to emergency department: Secondary | ICD-10-CM | POA: Diagnosis not present

## 2017-07-07 DIAGNOSIS — L8990 Pressure ulcer of unspecified site, unspecified stage: Secondary | ICD-10-CM | POA: Insufficient documentation

## 2017-07-07 DIAGNOSIS — S0101XA Laceration without foreign body of scalp, initial encounter: Secondary | ICD-10-CM | POA: Diagnosis present

## 2017-07-07 DIAGNOSIS — W19XXXA Unspecified fall, initial encounter: Secondary | ICD-10-CM

## 2017-07-07 DIAGNOSIS — F419 Anxiety disorder, unspecified: Secondary | ICD-10-CM | POA: Diagnosis present

## 2017-07-07 DIAGNOSIS — S065XAA Traumatic subdural hemorrhage with loss of consciousness status unknown, initial encounter: Secondary | ICD-10-CM

## 2017-07-07 DIAGNOSIS — M479 Spondylosis, unspecified: Secondary | ICD-10-CM | POA: Diagnosis present

## 2017-07-07 DIAGNOSIS — S6992XA Unspecified injury of left wrist, hand and finger(s), initial encounter: Secondary | ICD-10-CM | POA: Diagnosis not present

## 2017-07-07 DIAGNOSIS — S0191XA Laceration without foreign body of unspecified part of head, initial encounter: Secondary | ICD-10-CM | POA: Diagnosis not present

## 2017-07-07 DIAGNOSIS — Z7982 Long term (current) use of aspirin: Secondary | ICD-10-CM

## 2017-07-07 DIAGNOSIS — Z982 Presence of cerebrospinal fluid drainage device: Secondary | ICD-10-CM

## 2017-07-07 DIAGNOSIS — S61412A Laceration without foreign body of left hand, initial encounter: Secondary | ICD-10-CM | POA: Diagnosis present

## 2017-07-07 DIAGNOSIS — S0190XA Unspecified open wound of unspecified part of head, initial encounter: Secondary | ICD-10-CM | POA: Diagnosis not present

## 2017-07-07 DIAGNOSIS — R531 Weakness: Secondary | ICD-10-CM | POA: Diagnosis not present

## 2017-07-07 DIAGNOSIS — G912 (Idiopathic) normal pressure hydrocephalus: Secondary | ICD-10-CM | POA: Diagnosis not present

## 2017-07-07 DIAGNOSIS — S065X9A Traumatic subdural hemorrhage with loss of consciousness of unspecified duration, initial encounter: Secondary | ICD-10-CM | POA: Diagnosis present

## 2017-07-07 DIAGNOSIS — R5381 Other malaise: Secondary | ICD-10-CM | POA: Diagnosis present

## 2017-07-07 DIAGNOSIS — F17200 Nicotine dependence, unspecified, uncomplicated: Secondary | ICD-10-CM | POA: Diagnosis present

## 2017-07-07 DIAGNOSIS — S0990XA Unspecified injury of head, initial encounter: Secondary | ICD-10-CM | POA: Diagnosis not present

## 2017-07-07 DIAGNOSIS — S065X0A Traumatic subdural hemorrhage without loss of consciousness, initial encounter: Secondary | ICD-10-CM | POA: Diagnosis not present

## 2017-07-07 DIAGNOSIS — F329 Major depressive disorder, single episode, unspecified: Secondary | ICD-10-CM | POA: Diagnosis present

## 2017-07-07 DIAGNOSIS — Z87442 Personal history of urinary calculi: Secondary | ICD-10-CM

## 2017-07-07 DIAGNOSIS — W01190A Fall on same level from slipping, tripping and stumbling with subsequent striking against furniture, initial encounter: Secondary | ICD-10-CM | POA: Diagnosis present

## 2017-07-07 DIAGNOSIS — N400 Enlarged prostate without lower urinary tract symptoms: Secondary | ICD-10-CM | POA: Diagnosis present

## 2017-07-07 DIAGNOSIS — Z888 Allergy status to other drugs, medicaments and biological substances status: Secondary | ICD-10-CM

## 2017-07-07 HISTORY — DX: Calculus of kidney: N20.0

## 2017-07-07 HISTORY — DX: Benign prostatic hyperplasia without lower urinary tract symptoms: N40.0

## 2017-07-07 HISTORY — DX: Spondylosis, unspecified: M47.9

## 2017-07-07 HISTORY — DX: Unspecified osteoarthritis, unspecified site: M19.90

## 2017-07-07 HISTORY — DX: Wedge compression fracture of second lumbar vertebra, initial encounter for closed fracture: S32.020A

## 2017-07-07 HISTORY — DX: Traumatic subdural hemorrhage with loss of consciousness of unspecified duration, initial encounter: S06.5X9A

## 2017-07-07 HISTORY — DX: Unspecified cataract: H26.9

## 2017-07-07 HISTORY — DX: Unilateral inguinal hernia, without obstruction or gangrene, not specified as recurrent: K40.90

## 2017-07-07 MED ORDER — TETANUS-DIPHTH-ACELL PERTUSSIS 5-2.5-18.5 LF-MCG/0.5 IM SUSP
0.5000 mL | Freq: Once | INTRAMUSCULAR | Status: AC
  Administered 2017-07-07: 0.5 mL via INTRAMUSCULAR
  Filled 2017-07-07: qty 0.5

## 2017-07-07 MED ORDER — LIDOCAINE-EPINEPHRINE (PF) 2 %-1:200000 IJ SOLN
20.0000 mL | Freq: Once | INTRAMUSCULAR | Status: AC
  Administered 2017-07-07: 20 mL
  Filled 2017-07-07: qty 20

## 2017-07-07 NOTE — ED Triage Notes (Signed)
Pt is presented from home, reportedly had a fall where landed on a table sustaining a approx 5cm head laceration to the anterior temporal aspect of his head. Bleeding controlled at this time, also sustained injuries on his left hand.

## 2017-07-07 NOTE — ED Notes (Signed)
Bed: [facility] Expected date:  Expected time:  Means of arrival:  Comments: 83 yo M/Fall laceration

## 2017-07-07 NOTE — ED Provider Notes (Signed)
[place] DEPT Provider Note   CSN: [id number] Arrival date & time: 07/07/17  2104    History   Chief Complaint Chief Complaint  Patient presents with  . Head Laceration  . Fall    HPI Mitchell [name] is a 83 y.o. male.  83 year old male with history of normal pressure hydrocephalus s/p shunting presents to the emergency department after a fall from home tonight. He states that he tripped and fell causing him to land on a table. He sustained a laceration to his left hand as well as to the top of his scalp. He denies loss of consciousness. He states that he "feels fine". Patient not on blood thinners. No vomiting since the incident. Tdap unknown.      History reviewed. No pertinent past medical history.  There are no active problems to display for this patient.   History reviewed. No pertinent surgical history.     Home Medications    Prior to Admission medications   Medication Sig Start Date End Date Taking? Authorizing Provider  acetaminophen (TYLENOL) 500 MG tablet Take 500 mg by mouth every 6 (six) hours as needed for mild pain or moderate pain.   Yes [provider]  ALPRAZolam (XANAX) 0.25 MG tablet Take 0.125 mg by mouth every 6 (six) hours as needed for anxiety.  06/29/17  Yes [provider]  aspirin 81 MG chewable tablet Chew 81 mg by mouth daily.   Yes [provider]  buPROPion (WELLBUTRIN XL) 150 MG 24 hr tablet Take 150 mg by mouth every morning. 06/29/17  Yes [provider]  CALCIUM PO Take 500 mg by mouth daily.   Yes [provider]  Multiple Vitamin (MULTIVITAMIN WITH MINERALS) TABS tablet Take 1 tablet by mouth daily.   Yes [provider]  MYRBETRIQ 50 MG TB24 tablet Take 50 mg by mouth daily. 06/07/17  Yes [provider]  Omega-3 Fatty Acids (FISH OIL) 1000 MG CAPS Take 1,000 mg by mouth daily.   Yes [provider]  tamsulosin (FLOMAX) 0.4 MG CAPS capsule Take 0.4 mg by mouth  daily. 06/07/17  Yes [provider]  cephALEXin (KEFLEX) 500 MG capsule Take 1 capsule (500 mg total) by mouth 4 (four) times daily. 07/08/17   Antonietta Breach, PA-C   Current Outpatient Prescriptions (per Care Everywhere):  . acetaminophen (TYLENOL) 500 MG tablet, Take 500 mg by mouth as needed. , Disp: , Rfl:  . aspirin 81 MG chewable tablet, Take 81 mg by mouth every morning. , Disp: , Rfl:  . calcium 500 mg Tab, Take 500 mg by mouth every morning. , Disp: , Rfl:  . glucosamine-chondroit-vit C-Mn (GLUCOSAMINE-CHONDROITIN COMPLX) Cap, Take 1 capsule by mouth 2 times daily. , Disp: , Rfl:  . multivitamin (MULTIVITAMIN) per tablet, Take 1 tablet by mouth daily., Disp: , Rfl:  . MYRBETRIQ 25 mg Tb24, TAKE 1 TABLET BY MOUTH EVERY DAY, Disp: , Rfl: 11 . MYRBETRIQ 50 mg Tb24, TAKE 1 TABLET BY MOUTH EVERY DAY, Disp: , Rfl: 11 . omega 3-dha-epa-fish oil 1,200 (144-216) mg Cap, Take 1 capsule by mouth 2 times daily. , Disp: , Rfl:  . tamsulosin (FLOMAX) 0.4 mg Cp24, Take 0.4 mg by mouth every morning. , Disp: , Rfl:  . darifenacin (ENABLEX) 7.5 MG 24 hr tablet, Take 7.5 mg by mouth daily., Disp: , Rfl:  . escitalopram oxalate (LEXAPRO) 10 MG tablet, TAKE 1 TABLET BY MOUTH DAILY, Disp: , Rfl: 5 . sertraline (ZOLOFT) 25 MG tablet,  TAKE 1 TABLET BY MOUTH EVERY DAY, Disp: , Rfl: 3   Family History History reviewed. No pertinent family history.  Social History Social History  Substance Use Topics  . Smoking status: Current Every Day Smoker  . Smokeless tobacco: Not on file  . Alcohol use No     Allergies   Quinine derivatives   Review of Systems Review of Systems Ten systems reviewed and are negative for acute change, except as noted in the HPI.    Physical Exam Updated Vital Signs BP 132/86 (BP Location: Left Arm)   Pulse (!) 108   Temp 98 F (36.7 C)   Resp 18   SpO2 98%   Physical Exam  Constitutional: He is oriented to person, place, and time. He appears well-developed  and well-nourished. No distress.  Nontoxic and in NAD  HENT:  Head: Normocephalic.  Laceration to top of scalp; bleeding controlled.  Eyes: Conjunctivae and EOM are normal. No scleral icterus.  Neck: Normal range of motion.  Normal ROM; patient moving all extremities.  Cardiovascular: Normal rate, regular rhythm and intact distal pulses.   Distal radial pulse 2+ in the LUE; capillary refill brisk in all digits of the L hand.  Pulmonary/Chest: Effort normal. No respiratory distress. He has no wheezes. He has no rales.  Respirations even and unlabored. Lungs CTAB.  Abdominal: Soft. There is no tenderness.  Soft, nontender abdomen.  Musculoskeletal: Normal range of motion.       Hands: Neurological: He is alert and oriented to person, place, and time. He exhibits normal muscle tone. Coordination normal.  GCS 15. Patient answers questions appropriately and follows commands.  Skin: Skin is warm and dry. No rash noted. He is not diaphoretic. No erythema. No pallor.  Psychiatric: He has a normal mood and affect. His behavior is normal.  Nursing note and vitals reviewed.    ED Treatments / Results  Labs (all labs ordered are listed, but only abnormal results are displayed) Labs Reviewed  CBC WITH DIFFERENTIAL/PLATELET - Abnormal; Notable for the following:       Result Value   WBC 10.7 (*)    RBC 4.06 (*)    Hemoglobin 12.2 (*)    HCT 36.7 (*)    Platelets 127 (*)    Neutro Abs 7.8 (*)    Monocytes Absolute 1.5 (*)    All other components within normal limits  BASIC METABOLIC PANEL - Abnormal; Notable for the following:    Glucose, Bld 100 (*)    BUN 22 (*)    Creatinine, Ser 1.31 (*)    GFR calc non Af Amer 47 (*)    GFR calc Af Amer 54 (*)    All other components within normal limits  PROTIME-INR  SAMPLE TO BLOOD BANK    EKG  EKG Interpretation None       Radiology Ct Head Wo Contrast  Result Date: 07/07/2017 CLINICAL DATA:  Status post fall, with laceration at  the anterior aspect of the head. Initial encounter. EXAM: CT HEAD WITHOUT CONTRAST TECHNIQUE: Contiguous axial images were obtained from the base of the skull through the vertex without intravenous contrast. COMPARISON:  MRI of the brain performed 01/18/2005 FINDINGS: Brain: Subacute bilateral subdural hematomas are noted, measuring up to 5 mm on the right and 1.0 cm on the left. Hydrocephalus appears relatively stable, with associated right-sided ventriculoperitoneal shunt ending at the left lateral ventricle. Moderate cortical volume loss is noted. Mild cerebellar atrophy is noted. Scattered periventricular white matter  change likely reflects small vessel ischemic microangiopathy. The brainstem and fourth ventricle are within normal limits. The basal ganglia are unremarkable in appearance. The cerebral hemispheres demonstrate grossly normal gray-white differentiation. No mass effect or midline shift is seen. Vascular: No hyperdense vessel or unexpected calcification. Skull: There is no evidence of fracture; visualized osseous structures are unremarkable in appearance. Sinuses/Orbits: The visualized portions of the orbits are within normal limits. The paranasal sinuses and mastoid air cells are well-aerated. Other: No significant soft tissue abnormalities are seen. IMPRESSION: 1. Subacute bilateral subdural hematomas, measuring up to 5 mm on the right and 1.0 cm on the left. No significant midline shift seen. 2. Relatively stable appearance to hydrocephalus, with associated right-sided ventriculoperitoneal shunt ending at the left lateral ventricle. 3. Moderate cortical volume loss and scattered small vessel ischemic microangiopathy. These results were called by telephone at the time of interpretation on 07/07/2017 at 10:39 pm to [facility] PA, who verbally acknowledged these results. Electronically Signed   By: Garald Balding M.D.   On: 07/07/2017 22:40   Dg Hand Complete Left  Result Date: 07/07/2017 CLINICAL  DATA:  Status post fall, with left hand injury. Initial encounter. EXAM: LEFT HAND - COMPLETE 3+ VIEW COMPARISON:  None. FINDINGS: There is no evidence of fracture or dislocation. Chronic subluxation is noted at the second and third metacarpophalangeal joints. 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 Mild degenerative change is noted at the first carpometacarpal joint. Subcortical cysts are noted at the proximal carpal row. The carpal rows are otherwise intact, and demonstrate normal alignment. The soft tissues are unremarkable in appearance. IMPRESSION: 1. No evidence of fracture or dislocation. 2. Erosive osteoarthritis noted. Electronically Signed   By: Garald Balding M.D.   On: 07/07/2017 22:09    Procedures Procedures (including critical care time)  Medications Ordered in ED Medications  lidocaine-EPINEPHrine (XYLOCAINE W/EPI) 2 %-1:200000 (PF) injection 20 mL (20 mLs Infiltration Given 07/07/17 2349)  Tdap (BOOSTRIX) injection 0.5 mL (0.5 mLs Intramuscular Given 07/07/17 2349)     LACERATION REPAIR Performed by: Antonietta Breach Authorized by: Antonietta Breach Consent: Verbal consent obtained. Risks and benefits: risks, benefits and alternatives were discussed Consent given by: patient Patient identity confirmed: provided demographic data Prepped and Draped in normal sterile fashion Wound explored  Laceration Location: posterior parietal, left  Laceration Length: 2cm  No Foreign Bodies seen or palpated  Amount of cleaning: standard  Skin closure: staples  Number of sutures: 2  Technique: simple  Patient tolerance: Patient tolerated the procedure well with no immediate complications.   LACERATION REPAIR Performed by: Antonietta Breach Authorized by: Antonietta Breach Consent: Verbal consent obtained. Risks and benefits: risks, benefits and alternatives were discussed Consent given by: patient Patient identity confirmed: provided demographic  data Prepped and Draped in normal sterile fashion Wound explored  Laceration Location: L hand  Laceration Length: 5cm  No Foreign Bodies seen or palpated  Anesthesia: local infiltration  Local anesthetic: lidocaine 2% with epinephrine  Anesthetic total: 3 ml  Irrigation method: syringe Amount of cleaning: standard  Skin closure: 4-0 ethilon  Number of sutures: 4  Technique: simple interrupted  Patient tolerance: Patient tolerated the procedure well with no immediate complications.    Initial Impression / Assessment and Plan / ED Course  I have reviewed the triage vital signs and the nursing notes.  Pertinent labs & imaging results that were available during my care of the patient were reviewed by me and considered in my medical  decision making (see chart for details).     9:30 PM Patient presents after an unwitnessed fall at home. He reports tripping and striking a table. He denies loss of consciousness. No focal deficits noted. He did sustain a laceration to his scalp which was closed with 2 staples. Skin laceration also noted. Will obtain head CT and hand x-ray. Family states that patient is acting at baseline. He lives at home with his wife.  11:00 PM Call received from radiology after CT read revealed to subacute subdural hematomas. Will discuss with neurosurgery  11:20 PM Case discussed with Dr. Ellene Route neurosurgery. He has reviewed the patient's imaging and states that the subdurals appear more chronic in etiology. No indication for acute neurosurgical intervention at this time. Neurosurgery states that observation is reasonable; however, patient may also be a candidate for outpatient monitoring should family feel comfortable with this. Case discussed with Dr. Gilford Raid who agrees with plan of care; will discuss with family to determine level of comfort for outpatient follow up.  11:35 PM Plan discussed between Dr. Gilford Raid and family. Family expresses comfort for  outpatient follow-up. Will proceed with hand laceration and subsequent discharge.  1:15 AM Laceration repaired without complications. Wound to be dressed by RN. Patient and family express comfort with discharge.  2:00 AM Family now uncomfortable with discharge. When attempting to get the patient up to transition to a wheelchair, he was unable to support himself. Family would feel more comfortable with observation. Will obtain laboratory workup and admit once resulted.  4:35 AM Labs resulted. Will consult [facility] for admission.  4:49 AM Case discussed with Dr. Blaine Hamper of [facility] who will admit.   Final Clinical Impressions(s) / ED Diagnoses   Final diagnoses:  Subacute subdural hematoma (HCC)  Scalp laceration, initial encounter  Laceration of left hand without foreign body, initial encounter  Fall, initial encounter    New Prescriptions New Prescriptions   CEPHALEXIN (KEFLEX) 500 MG CAPSULE    Take 1 capsule (500 mg total) by mouth 4 (four) times daily.     Antonietta Breach, PA-C 07/08/17 0153    Antonietta Breach, PA-C 07/08/17 4235    Isla Pence, MD 07/08/17 1224

## 2017-07-08 ENCOUNTER — Encounter (HOSPITAL_COMMUNITY): Payer: Self-pay | Admitting: *Deleted

## 2017-07-08 DIAGNOSIS — I6200 Nontraumatic subdural hemorrhage, unspecified: Secondary | ICD-10-CM | POA: Diagnosis not present

## 2017-07-08 DIAGNOSIS — S065X0A Traumatic subdural hemorrhage without loss of consciousness, initial encounter: Secondary | ICD-10-CM | POA: Diagnosis present

## 2017-07-08 DIAGNOSIS — S065XAA Traumatic subdural hemorrhage with loss of consciousness status unknown, initial encounter: Secondary | ICD-10-CM | POA: Diagnosis present

## 2017-07-08 DIAGNOSIS — N400 Enlarged prostate without lower urinary tract symptoms: Secondary | ICD-10-CM | POA: Diagnosis present

## 2017-07-08 DIAGNOSIS — R402412 Glasgow coma scale score 13-15, at arrival to emergency department: Secondary | ICD-10-CM | POA: Diagnosis present

## 2017-07-08 DIAGNOSIS — W19XXXA Unspecified fall, initial encounter: Secondary | ICD-10-CM | POA: Diagnosis not present

## 2017-07-08 DIAGNOSIS — Z23 Encounter for immunization: Secondary | ICD-10-CM | POA: Diagnosis not present

## 2017-07-08 DIAGNOSIS — Z982 Presence of cerebrospinal fluid drainage device: Secondary | ICD-10-CM | POA: Diagnosis not present

## 2017-07-08 DIAGNOSIS — Z888 Allergy status to other drugs, medicaments and biological substances status: Secondary | ICD-10-CM | POA: Diagnosis not present

## 2017-07-08 DIAGNOSIS — G912 (Idiopathic) normal pressure hydrocephalus: Secondary | ICD-10-CM | POA: Diagnosis present

## 2017-07-08 DIAGNOSIS — F419 Anxiety disorder, unspecified: Secondary | ICD-10-CM | POA: Diagnosis present

## 2017-07-08 DIAGNOSIS — F17200 Nicotine dependence, unspecified, uncomplicated: Secondary | ICD-10-CM | POA: Diagnosis present

## 2017-07-08 DIAGNOSIS — L8990 Pressure ulcer of unspecified site, unspecified stage: Secondary | ICD-10-CM | POA: Insufficient documentation

## 2017-07-08 DIAGNOSIS — F329 Major depressive disorder, single episode, unspecified: Secondary | ICD-10-CM | POA: Diagnosis present

## 2017-07-08 DIAGNOSIS — W01190A Fall on same level from slipping, tripping and stumbling with subsequent striking against furniture, initial encounter: Secondary | ICD-10-CM | POA: Diagnosis not present

## 2017-07-08 DIAGNOSIS — R5381 Other malaise: Secondary | ICD-10-CM | POA: Diagnosis present

## 2017-07-08 DIAGNOSIS — S61412A Laceration without foreign body of left hand, initial encounter: Secondary | ICD-10-CM | POA: Diagnosis not present

## 2017-07-08 DIAGNOSIS — I6202 Nontraumatic subacute subdural hemorrhage: Secondary | ICD-10-CM | POA: Diagnosis not present

## 2017-07-08 DIAGNOSIS — Z87442 Personal history of urinary calculi: Secondary | ICD-10-CM | POA: Diagnosis not present

## 2017-07-08 DIAGNOSIS — S0101XD Laceration without foreign body of scalp, subsequent encounter: Secondary | ICD-10-CM | POA: Diagnosis not present

## 2017-07-08 DIAGNOSIS — M479 Spondylosis, unspecified: Secondary | ICD-10-CM | POA: Diagnosis present

## 2017-07-08 DIAGNOSIS — R531 Weakness: Secondary | ICD-10-CM | POA: Diagnosis not present

## 2017-07-08 DIAGNOSIS — M6281 Muscle weakness (generalized): Secondary | ICD-10-CM | POA: Diagnosis not present

## 2017-07-08 DIAGNOSIS — S0101XA Laceration without foreign body of scalp, initial encounter: Secondary | ICD-10-CM | POA: Diagnosis not present

## 2017-07-08 DIAGNOSIS — I619 Nontraumatic intracerebral hemorrhage, unspecified: Secondary | ICD-10-CM | POA: Diagnosis not present

## 2017-07-08 DIAGNOSIS — S065X9A Traumatic subdural hemorrhage with loss of consciousness of unspecified duration, initial encounter: Secondary | ICD-10-CM | POA: Diagnosis present

## 2017-07-08 DIAGNOSIS — Z7982 Long term (current) use of aspirin: Secondary | ICD-10-CM | POA: Diagnosis not present

## 2017-07-08 LAB — BASIC METABOLIC PANEL
ANION GAP: 9 (ref 5–15)
BUN: 22 mg/dL — ABNORMAL HIGH (ref 6–20)
CALCIUM: 9.2 mg/dL (ref 8.9–10.3)
CHLORIDE: 107 mmol/L (ref 101–111)
CO2: 23 mmol/L (ref 22–32)
CREATININE: 1.31 mg/dL — AB (ref 0.61–1.24)
GFR calc non Af Amer: 47 mL/min — ABNORMAL LOW (ref >60)
GFR, EST AFRICAN AMERICAN: 54 mL/min — AB (ref >60)
GLUCOSE: 100 mg/dL — AB (ref 65–99)
Potassium: 4.4 mmol/L (ref 3.5–5.1)
Sodium: 139 mmol/L (ref 135–145)

## 2017-07-08 LAB — CBC WITH DIFFERENTIAL/PLATELET
Basophils Absolute: 0 10*3/uL (ref 0.0–0.1)
Basophils Relative: 0 %
Eosinophils Absolute: 0.2 10*3/uL (ref 0.0–0.7)
Eosinophils Relative: 2 %
HEMATOCRIT: 36.7 % — AB (ref 39.0–52.0)
HEMOGLOBIN: 12.2 g/dL — AB (ref 13.0–17.0)
LYMPHS ABS: 1.2 10*3/uL (ref 0.7–4.0)
LYMPHS PCT: 11 %
MCH: 30 pg (ref 26.0–34.0)
MCHC: 33.2 g/dL (ref 30.0–36.0)
MCV: 90.4 fL (ref 78.0–100.0)
MONO ABS: 1.5 10*3/uL — AB (ref 0.1–1.0)
MONOS PCT: 14 %
NEUTROS ABS: 7.8 10*3/uL — AB (ref 1.7–7.7)
NEUTROS PCT: 73 %
Platelets: 127 10*3/uL — ABNORMAL LOW (ref 150–400)
RBC: 4.06 MIL/uL — ABNORMAL LOW (ref 4.22–5.81)
RDW: 13.8 % (ref 11.5–15.5)
WBC: 10.7 10*3/uL — ABNORMAL HIGH (ref 4.0–10.5)

## 2017-07-08 LAB — PROTIME-INR
INR: 1.15
Prothrombin Time: 14.8 seconds (ref 11.4–15.2)

## 2017-07-08 LAB — SAMPLE TO BLOOD BANK

## 2017-07-08 MED ORDER — ONDANSETRON HCL 4 MG/2ML IJ SOLN
4.0000 mg | Freq: Three times a day (TID) | INTRAMUSCULAR | Status: DC | PRN
  Administered 2017-07-08: 4 mg via INTRAVENOUS
  Filled 2017-07-08: qty 2

## 2017-07-08 MED ORDER — POLYETHYLENE GLYCOL 3350 17 G PO PACK: 17.0000 g | PACK | Freq: Every day | ORAL | Status: DC | PRN

## 2017-07-08 MED ORDER — OXYCODONE-ACETAMINOPHEN 5-325 MG PO TABS: 1.0000 | ORAL_TABLET | ORAL | Status: DC | PRN

## 2017-07-08 MED ORDER — ASPIRIN 81 MG PO CHEW: 81.0000 mg | CHEWABLE_TABLET | Freq: Every day | ORAL | Status: DC

## 2017-07-08 MED ORDER — ACETAMINOPHEN 500 MG PO TABS: 500.0000 mg | ORAL_TABLET | Freq: Four times a day (QID) | ORAL | Status: DC | PRN

## 2017-07-08 MED ORDER — SODIUM CHLORIDE 0.9 % IV SOLN
INTRAVENOUS | Status: DC
  Administered 2017-07-08 (×2): via INTRAVENOUS

## 2017-07-08 MED ORDER — MIRABEGRON ER 25 MG PO TB24
50.0000 mg | ORAL_TABLET | Freq: Every day | ORAL | Status: DC
  Administered 2017-07-08 – 2017-07-10 (×3): 50 mg via ORAL
  Filled 2017-07-08 (×3): qty 2

## 2017-07-08 MED ORDER — CALCIUM CARBONATE 1250 (500 CA) MG PO TABS: 1250.0000 mg | ORAL_TABLET | Freq: Every day | ORAL | Status: DC

## 2017-07-08 MED ORDER — CEPHALEXIN 500 MG PO CAPS: 500.0000 mg | ORAL_CAPSULE | Freq: Four times a day (QID) | ORAL | 0 refills | Status: DC

## 2017-07-08 MED ORDER — ALPRAZOLAM 0.25 MG PO TABS
0.1250 mg | ORAL_TABLET | Freq: Four times a day (QID) | ORAL | Status: DC | PRN
Start: 2017-07-08 — End: 2017-07-09
  Administered 2017-07-08 (×2): 0.125 mg via ORAL
  Filled 2017-07-08 (×2): qty 1

## 2017-07-08 MED ORDER — TAMSULOSIN HCL 0.4 MG PO CAPS
0.4000 mg | ORAL_CAPSULE | Freq: Every day | ORAL | Status: DC
  Administered 2017-07-08 – 2017-07-10 (×3): 0.4 mg via ORAL
  Filled 2017-07-08 (×3): qty 1

## 2017-07-08 MED ORDER — OMEGA-3-ACID ETHYL ESTERS 1 G PO CAPS
1.0000 g | ORAL_CAPSULE | Freq: Every day | ORAL | Status: DC
Start: 2017-07-08 — End: 2017-07-08

## 2017-07-08 MED ORDER — ADULT MULTIVITAMIN W/MINERALS CH: 1.0000 | ORAL_TABLET | Freq: Every day | ORAL | Status: DC

## 2017-07-08 MED ORDER — BUPROPION HCL ER (XL) 150 MG PO TB24
150.0000 mg | ORAL_TABLET | Freq: Every morning | ORAL | Status: DC
  Administered 2017-07-08 – 2017-07-10 (×3): 150 mg via ORAL
  Filled 2017-07-08 (×3): qty 1

## 2017-07-08 MED ORDER — MIRABEGRON ER 25 MG PO TB24
50.0000 mg | ORAL_TABLET | Freq: Every day | ORAL | Status: DC
  Filled 2017-07-08: qty 2

## 2017-07-08 NOTE — H&P (Signed)
History and Physical:    Mitchell [name]   RJJ:[id number] DOB: 16-Feb-1925 DOA: 07/07/2017  Referring MD/provider: PA Deborah Chalk PCP: [name], MD   Patient coming from: Home  Chief Complaint: Fall and head trauma  History of Present Illness:   Mitchell [name] is an 83 y.o. male with PMH sig for NPH, dementia with agitation and urinary incontinence who was brought in to ED last night after a fall. Per patient's daughter and wife, patient has been falling more recently; over the past 5 weeks he's had 3 falls. She also notes that he's been increasingly debilitated over the past several months as he has spent most of his time sitting in a recliner rather than getting his usual exercise. Patient's wife notes that he has been increasingly agitated and angry over the past 6 months which has led him to be more reclusive. He's been seen by a geriatrician who started him on Wellbutrin and Xanax 2 weeks ago. She notes that his mood and behavior much improved since he was started on those medications.  Patient's wife notes that she does not think the Xanax or Wellbutrin have contributed to his falling. She notes he was falling prior to these being started. She  she does notenotes that last night's fall was the worst because he was unable to get back up after he fell. There was no change in his baseline mentation since the fall but he did hit his head against a table.   ED Course:  The patient  was evaluated in the ED and CT revealed bilateral subdural hematomas one was 5 mm the other about a centimeter. This was discussed with neurosurgery who felt that there was no need for any surgery and could be managed conservatively. Family was given a take patient home however he was unable to get up out of bed which is unusual for him so he was admitted.   ROS:   ROS patient himself says he feels well. He denies any headache dizziness shortness of breath chest pain dysuria flank pain abdominal pain diarrhea.  He says he is hungry and generally feels okay.   Past Medical History:   Past Medical History:  Diagnosis Date  . Arthritis   . BPH (benign prostatic hyperplasia)   . Calcium nephrolithiasis   . Cataract   . Compression fracture of L2 lumbar vertebra (HCC)    and L4  . Inguinal hernia   . Osteoarthritis   . Spondylosis   . Subdural hematoma (HCC)     Past Surgical History:   Status post craniotomy with VP stent placement for NPH.  Social History:   Social History   Social History  . Marital status: Married    Spouse name: N/A  . Number of children: N/A  . Years of education: N/A   Occupational History  . Not on file.   Social History Main Topics  . Smoking status: Current Every Day Smoker  . Smokeless tobacco: Not on file  . Alcohol use No  . Drug use: Unknown  . Sexual activity: No   Other Topics Concern  . Not on file   Social History Narrative  . No narrative on file    Allergies   Moxifloxacin and Quinine derivatives  Family history:   History reviewed. No pertinent family history.  Current Medications:   Prior to Admission medications   Medication Sig Start Date End Date Taking? Authorizing Provider  acetaminophen (TYLENOL) 500 MG tablet Take 500 mg  by mouth every 6 (six) hours as needed for mild pain or moderate pain.   Yes [provider]  ALPRAZolam (XANAX) 0.25 MG tablet Take 0.125 mg by mouth every 6 (six) hours as needed for anxiety.  06/29/17  Yes [provider]  aspirin 81 MG chewable tablet Chew 81 mg by mouth daily.   Yes [provider]  buPROPion (WELLBUTRIN XL) 150 MG 24 hr tablet Take 150 mg by mouth every morning. 06/29/17  Yes [provider]  CALCIUM PO Take 500 mg by mouth daily.   Yes [provider]  Multiple Vitamin (MULTIVITAMIN WITH MINERALS) TABS tablet Take 1 tablet by mouth daily.   Yes [provider]  MYRBETRIQ 50 MG TB24 tablet Take 50 mg by mouth daily. 06/07/17  Yes  [provider]  Omega-3 Fatty Acids (FISH OIL) 1000 MG CAPS Take 1,000 mg by mouth daily.   Yes [provider]  tamsulosin (FLOMAX) 0.4 MG CAPS capsule Take 0.4 mg by mouth daily. 06/07/17  Yes [provider]  cephALEXin (KEFLEX) 500 MG capsule Take 1 capsule (500 mg total) by mouth 4 (four) times daily. 07/08/17   Antonietta Breach, PA-C    Physical Exam:   Vitals:   07/07/17 2251 07/08/17 0141 07/08/17 0452 07/08/17 0621  BP: 95/62 132/86 127/81 121/73  Pulse: 82 (!) 108 (!) 108 (!) 115  Resp: 18 18 16    Temp: 98 F (36.7 C)   98.3 F (36.8 C)  TempSrc:    Oral  SpO2: 97% 98% 94% 96%     Physical Exam: Blood pressure 121/73, pulse (!) 115, temperature 98.3 F (36.8 C), temperature source Oral, resp. rate 16, SpO2 96 %. Gen: Elderly patient lying in bed in no acute distress intermittently speaking with his wife. HEENT: Lacerations on head are covered with clean dressings. Pupils equal, round and reactive to light. Extraocular movements intact.  Sclerae nonicteric. Neck: no jugular venous distention. Chest: Lungs are clear to auscultation with good air movement. No rales, rhonchi or wheezes.  CV: Heart sounds are regular with an S1, S2. No murmurs, rubs, clicks, or gallops.  Abdomen: Soft, nontender, nondistended with normal active bowel sounds.  Extremities: Extremities are without clubbing, or cyanosis. Skinseveral scratches on skin with some areas of ecchymoses on his forearms bilaterally. Hand is dressed with a clean bandage. Neuro:  patient is awake and alert and oriented to person and place. He has some increased tones in his upper and lower extremities bilaterally. He is grossly nonfocal Psych: . Patient has moderate dementia but is cooperative and answers questions appropriately.   Data Review:    Labs: Basic Metabolic Panel:  Recent Labs Lab 07/08/17 0200  NA 139  K 4.4  CL 107  CO2 23  GLUCOSE 100*  BUN 22*  CREATININE 1.31*    CALCIUM 9.2   Liver Function Tests: No results for input(s): AST, ALT, ALKPHOS, BILITOT, PROT, ALBUMIN in the last 168 hours. No results for input(s): LIPASE, AMYLASE in the last 168 hours. No results for input(s): AMMONIA in the last 168 hours. CBC:  Recent Labs Lab 07/08/17 0200  WBC 10.7*  NEUTROABS 7.8*  HGB 12.2*  HCT 36.7*  MCV 90.4  PLT 127*   Cardiac Enzymes: No results for input(s): CKTOTAL, CKMB, CKMBINDEX, TROPONINI in the last 168 hours.  BNP (last 3 results) No results for input(s): PROBNP in the last 8760 hours. CBG: No results for input(s): GLUCAP in the last 168 hours.  Urinalysis  No results found for: COLORURINE, APPEARANCEUR, LABSPEC, PHURINE, GLUCOSEU, HGBUR, BILIRUBINUR, KETONESUR, PROTEINUR, UROBILINOGEN, NITRITE, LEUKOCYTESUR    Radiographic Studies: Ct Head Wo Contrast  Result Date: 07/07/2017 CLINICAL DATA:  Status post fall, with laceration at the anterior aspect of the head. Initial encounter. EXAM: CT HEAD WITHOUT CONTRAST TECHNIQUE: Contiguous axial images were obtained from the base of the skull through the vertex without intravenous contrast. COMPARISON:  MRI of the brain performed 01/18/2005 FINDINGS: Brain: Subacute bilateral subdural hematomas are noted, measuring up to 5 mm on the right and 1.0 cm on the left. Hydrocephalus appears relatively stable, with associated right-sided ventriculoperitoneal shunt ending at the left lateral ventricle. Moderate cortical volume loss is noted. Mild cerebellar atrophy is noted. Scattered periventricular white matter change likely reflects small vessel ischemic microangiopathy. The brainstem and fourth ventricle are within normal limits. The basal ganglia are unremarkable in appearance. The cerebral hemispheres demonstrate grossly normal gray-white differentiation. No mass effect or midline shift is seen. Vascular: No hyperdense vessel or unexpected calcification. Skull: There is no evidence of fracture;  visualized osseous structures are unremarkable in appearance. Sinuses/Orbits: The visualized portions of the orbits are within normal limits. The paranasal sinuses and mastoid air cells are well-aerated. Other: No significant soft tissue abnormalities are seen. IMPRESSION: 1. Subacute bilateral subdural hematomas, measuring up to 5 mm on the right and 1.0 cm on the left. No significant midline shift seen. 2. Relatively stable appearance to hydrocephalus, with associated right-sided ventriculoperitoneal shunt ending at the left lateral ventricle. 3. Moderate cortical volume loss and scattered small vessel ischemic microangiopathy. These results were called by telephone at the time of interpretation on 07/07/2017 at 10:39 pm to [facility] PA, who verbally acknowledged these results. Electronically Signed   By: Garald Balding M.D.   On: 07/07/2017 22:40   Dg Hand Complete Left  Result Date: 07/07/2017 CLINICAL DATA:  Status post fall, with left hand injury. Initial encounter. EXAM: LEFT HAND - COMPLETE 3+ VIEW COMPARISON:  None. FINDINGS: There is no evidence of fracture or dislocation. Chronic subluxation is noted at the second and third metacarpophalangeal joints. 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 Mild degenerative change is noted at the first carpometacarpal joint. Subcortical cysts are noted at the proximal carpal row. The carpal rows are otherwise intact, and demonstrate normal alignment. The soft tissues are unremarkable in appearance. IMPRESSION: 1. No evidence of fracture or dislocation. 2. Erosive osteoarthritis noted. Electronically Signed   By: Garald Balding M.D.   On: 07/07/2017 22:09    EKG: Independently reviewed.  Sinus tach at 117, normal intervals, normal axis, no acute ST-T wave changes. Normal EKG.  Assessment/Plan:   Active Problems:   SDH (subdural hematoma) (HCC)  SUBDURAL HEMATOMAS Will observe on telemetry with neuro checks. IV  hydration gentle for now [facility] request PT to evaluate the patient to make sure he is safe to go home versus rehabilitation as patient was unable to get out of bed in the ED last night when the family was about to take him home. We'll start soft solid diet today  DEBILITY Patient's wife and daughter are requesting possible referral to rehabilitation given his increasing weakness over the past couple of months.   I will place PT and OT consultations for further evaluation.  NPH VP shunt is being checked as an outpatient on a regular basis.  Other than subdural hematomas there is no acute issues on CT that will need to be addressed in house.  URINARY INCONTINENCE Will continue Flomax. However will hold MYRBETRIQ for now given known association with gait instability. Patient is clearly at high falls risk and would want to avoid reinjury of brain again.  ANXIETY AND DEPRESSION Continue bupropion as well as home doses of when necessary Xanax 0.125 every 6 hours. Would recommend reassessment of the utility of Xanax given gait instability as an outpatient per PCP.    Other information:   DVT prophylaxis: Treat with SCDs. Hold Lovenox given recent head bleed Code Status: Full code. Family Communication: Wife and daughter were at bedside throughout the encounter Disposition Plan: To be determined  Consults called: Neurosurgery Dr. Ellene Route was consulted in the ED. Admission status: OBSERVATION   The medical decision making is of moderate complexity, therefore this is a level 2 visit.  Dewaine Oats Derek Jack [facility] Pager [phone] Cell: [phone]   If 7PM-7AM, please contact night-coverage [email or web address] Password [facility] 07/08/2017, 8:19 AM

## 2017-07-08 NOTE — Progress Notes (Signed)
This is a no charge note  Pending admission per PA, Mitchell [name]  83 year old male with past medical history of NPH, s/p of VP shunt, depression, anxiety, BPH, tobacco abuse, who presents with fall with head injury. Found to have bilateral small SDH. Neurosurgeon, Dr. Ellene Route was consulted-->no need for surgery. Pt is placed on tele bed for obs.  Mitchell Costa, MD  [facility] Pager [phone]  If 7PM-7AM, please contact night-coverage [email or web address] Password [facility] 07/08/2017, 4:53 AM

## 2017-07-08 NOTE — Discharge Instructions (Signed)
Your head CT today shows that you have evidence of 2 small areas of hemorrhage/brain bleed. Does not appear that this occurred today, but did occur recently. Should you develop worsening headache, memory changes, increased falls, or other change from your usual self, we advise prompt return to the emergency department for evaluation. Avoid anti-inflammatory medications such as ibuprofen or Aleve. For your hand wound, we advise the use of Keflex to prevent wound infection. Do not soak your left hand in water for one week. You may wash the area gently with soap and warm water after 48 hours. Be sure to change the dressing once per day to keep the area clean. Use a wet-to-dry dressing by placing a wet gauze on the wound and securing it. Follow up with your primary care doctor for recheck as well as in 10 days for suture removal.

## 2017-07-08 NOTE — ED Notes (Signed)
Attempedt to obtain blood and start IV, pt refusing and resisting staff trying to help. Pt's daughter who states she 2nd listed on HCPOA is in agreement that he needs the treatment and if we need to hold him still to obtain blood, she is in agreement.

## 2017-07-08 NOTE — ED Notes (Signed)
Mitchell [name] able to obtain Blood with a lot of encouragement.

## 2017-07-08 NOTE — ED Notes (Signed)
Pt allowed nurse to take VS but refused to allow cardiac monitor be placed.

## 2017-07-09 ENCOUNTER — Inpatient Hospital Stay (HOSPITAL_COMMUNITY): Payer: Medicare Other

## 2017-07-09 ENCOUNTER — Encounter (HOSPITAL_COMMUNITY): Payer: Self-pay

## 2017-07-09 DIAGNOSIS — S0101XA Laceration without foreign body of scalp, initial encounter: Secondary | ICD-10-CM

## 2017-07-09 DIAGNOSIS — S61412A Laceration without foreign body of left hand, initial encounter: Secondary | ICD-10-CM

## 2017-07-09 DIAGNOSIS — W19XXXA Unspecified fall, initial encounter: Secondary | ICD-10-CM

## 2017-07-09 DIAGNOSIS — I6202 Nontraumatic subacute subdural hemorrhage: Secondary | ICD-10-CM

## 2017-07-09 LAB — COMPREHENSIVE METABOLIC PANEL
ALK PHOS: 50 U/L (ref 38–126)
ALT: 12 U/L — AB (ref 17–63)
AST: 18 U/L (ref 15–41)
Albumin: 2.9 g/dL — ABNORMAL LOW (ref 3.5–5.0)
Anion gap: 6 (ref 5–15)
BUN: 17 mg/dL (ref 6–20)
CALCIUM: 8.1 mg/dL — AB (ref 8.9–10.3)
CO2: 25 mmol/L (ref 22–32)
CREATININE: 1.29 mg/dL — AB (ref 0.61–1.24)
Chloride: 109 mmol/L (ref 101–111)
GFR, EST AFRICAN AMERICAN: 55 mL/min — AB (ref >60)
GFR, EST NON AFRICAN AMERICAN: 47 mL/min — AB (ref >60)
Glucose, Bld: 124 mg/dL — ABNORMAL HIGH (ref 65–99)
Potassium: 4.2 mmol/L (ref 3.5–5.1)
Sodium: 140 mmol/L (ref 135–145)
Total Bilirubin: 0.6 mg/dL (ref 0.3–1.2)
Total Protein: 5.7 g/dL — ABNORMAL LOW (ref 6.5–8.1)

## 2017-07-09 MED ORDER — ALPRAZOLAM 0.25 MG PO TABS
0.1250 mg | ORAL_TABLET | Freq: Three times a day (TID) | ORAL | Status: DC
  Administered 2017-07-09 – 2017-07-10 (×4): 0.125 mg via ORAL
  Filled 2017-07-09 (×4): qty 1

## 2017-07-09 NOTE — Progress Notes (Signed)
PROGRESS NOTE  Mitchell [name]  LTJ:[id number] DOB: 04/08/1925 DOA: 07/07/2017 PCP: [name], MD   Brief Narrative: Mitchell [name] is an 83 y.o. male with a medical history of NPH, dementia with agitation and urinary incontinence who was brought to the ED 8/11 after a fall. He's become increasingly irritable and agitated over the past 6 months, withdrawing, and more debilitated over the past several months, spending most of his time in a recliner. His geriatrician started wellbutrin and xanax 2 weeks prior which has improved his mood. He's had had 3 falls in the past 5 weeks, but couldn't get back up after this one. In the ED, a head CT revealed bilateral subdural hematomas, 83mm on the right and 1cm on the left without midline shift and no acute findings concerning VP shunt. This was discussed with neurosurgery, Dr. Ellene Route, who felt that there was no need for any surgery and could be managed conservatively. Family was given a take patient home however he was unable to get up out of bed which is unusual for him so he was admitted. Physical therapy has evaluated the patient, recommending SNF. CSW has been consulted. Surveillance CT has been ordered.   Assessment & Plan: Active Problems:   SDH (subdural hematoma) (HCC)   Pressure injury of skin   Subdural hematoma (HCC)  Traumatic subdural hematomas: Conservative management recommended by neurosurgery.  - Neuro checks stable - Surveillance CT ordered at ~36 hrs shows interval near complete resolution of the subacute right subdural hematoma and slight decreased size of the left subdural hematoma.  Scalp laceration: Undergoing normal wound healing s/p staples in ED 8/11.  - Will need staple removal between 10-14 days.   Debility and increased falls: Medications could be contributing (xanax, wellbutrin), though these have improved his mood and made him more mobile. - PT recommending SNF for rehabilitation. CSW consulted.  - Fall precautions.   - OT evaluation pending - Will continue medications for now  NPH: Chronic, appears stable on neuroimaging.  - No inpatient intervention planned.  Urinary incontinence:  - Continue flomax. Discussed increased fall risk with pharmacy Re: myrbetriq. Also reviewed package insert. I do not believe this is a primary cause of his fall and will restart myrbetriq cautiously.  Dementia with behavioral disturbance, depression, anxiety:  - Continue bupropion and scheduled very low dose xanax per family report TID.  - Monitor for sedation, as xanax is clearly a high risk medication in this elderly gentleman. Still, currently feel benefit has outweighed risk at this time.  DVT prophylaxis: SCDs Code Status: Full Family Communication: None at bedside this AM Disposition Plan: SNF when bed available.   Consultants:   Neurosurgery, Dr. Ellene Route by phone 8/11.  Procedures:   None  Antimicrobials:  None   Subjective: Pt feels diffusely weak but denies focal weakness or numbness. Sitter at bedside reports no overnight events other than single episode of urinary incontinence.   Objective: BP 111/72 (BP Location: Right Arm)   Pulse 97   Temp 97.7 F (36.5 C) (Axillary)   Resp 18   Ht 5\' 8"  (1.727 m)   Wt 73.8 kg (162 lb 11.2 oz)   SpO2 97%   BMI 24.74 kg/m   General exam: 83 y.o. male in no distress HEENT: Superior scalp laceration with 2 staples without active bleeding, significant edge separation, or significant erythema.  Respiratory system: Non-labored breathing room air. Clear to auscultation bilaterally.  Cardiovascular system: Regular rate and rhythm. No murmur, rub, or gallop.  No JVD, and no pedal edema. Gastrointestinal system: Abdomen soft, non-tender, non-distended, with normoactive bowel sounds. No organomegaly or masses felt. Central nervous system: Alert and oriented. No focal neurological deficits. Extremities: Warm, no deformities. Clean dressing on left dorsal ulnar  hand. Radial pulses 2+, cap refill brisk throughout, sensorimotor exam without deficits in LUE. Skin: Scalp and hand wounds as above, scattered diffuse SebK's. No other significant wounds. Psychiatry: Judgement and insight appear impaired. Mood euthymic & affect congruent.   Data Reviewed: I have personally reviewed following labs and imaging studies  CBC:  Recent Labs Lab 07/08/17 0200  WBC 10.7*  NEUTROABS 7.8*  HGB 12.2*  HCT 36.7*  MCV 90.4  PLT 258*   Basic Metabolic Panel:  Recent Labs Lab 07/08/17 0200 07/09/17 0450  NA 139 140  K 4.4 4.2  CL 107 109  CO2 23 25  GLUCOSE 100* 124*  BUN 22* 17  CREATININE 1.31* 1.29*  CALCIUM 9.2 8.1*   GFR: Estimated Creatinine Clearance: 37.6 mL/min (A) (by C-G formula based on SCr of 1.29 mg/dL (H)). Liver Function Tests:  Recent Labs Lab 07/09/17 0450  AST 18  ALT 12*  ALKPHOS 50  BILITOT 0.6  PROT 5.7*  ALBUMIN 2.9*   No results for input(s): LIPASE, AMYLASE in the last 168 hours. No results for input(s): AMMONIA in the last 168 hours. Coagulation Profile:  Recent Labs Lab 07/08/17 0200  INR 1.15   Cardiac Enzymes: No results for input(s): CKTOTAL, CKMB, CKMBINDEX, TROPONINI in the last 168 hours. BNP (last 3 results) No results for input(s): PROBNP in the last 8760 hours. HbA1C: No results for input(s): HGBA1C in the last 72 hours. CBG: No results for input(s): GLUCAP in the last 168 hours. Lipid Profile: No results for input(s): CHOL, HDL, LDLCALC, TRIG, CHOLHDL, LDLDIRECT in the last 72 hours. Thyroid Function Tests: No results for input(s): TSH, T4TOTAL, FREET4, T3FREE, THYROIDAB in the last 72 hours. Anemia Panel: No results for input(s): VITAMINB12, FOLATE, FERRITIN, TIBC, IRON, RETICCTPCT in the last 72 hours. Urine analysis: No results found for: COLORURINE, APPEARANCEUR, LABSPEC, PHURINE, GLUCOSEU, HGBUR, BILIRUBINUR, KETONESUR, PROTEINUR, UROBILINOGEN, NITRITE, LEUKOCYTESUR No results found  for this or any previous visit (from the past 240 hour(s)).    Radiology Studies: Ct Head Wo Contrast  Result Date: 07/07/2017 CLINICAL DATA:  Status post fall, with laceration at the anterior aspect of the head. Initial encounter. EXAM: CT HEAD WITHOUT CONTRAST TECHNIQUE: Contiguous axial images were obtained from the base of the skull through the vertex without intravenous contrast. COMPARISON:  MRI of the brain performed 01/18/2005 FINDINGS: Brain: Subacute bilateral subdural hematomas are noted, measuring up to 5 mm on the right and 1.0 cm on the left. Hydrocephalus appears relatively stable, with associated right-sided ventriculoperitoneal shunt ending at the left lateral ventricle. Moderate cortical volume loss is noted. Mild cerebellar atrophy is noted. Scattered periventricular white matter change likely reflects small vessel ischemic microangiopathy. The brainstem and fourth ventricle are within normal limits. The basal ganglia are unremarkable in appearance. The cerebral hemispheres demonstrate grossly normal gray-white differentiation. No mass effect or midline shift is seen. Vascular: No hyperdense vessel or unexpected calcification. Skull: There is no evidence of fracture; visualized osseous structures are unremarkable in appearance. Sinuses/Orbits: The visualized portions of the orbits are within normal limits. The paranasal sinuses and mastoid air cells are well-aerated. Other: No significant soft tissue abnormalities are seen. IMPRESSION: 1. Subacute bilateral subdural hematomas, measuring up to 5 mm on the right and 1.0 cm  on the left. No significant midline shift seen. 2. Relatively stable appearance to hydrocephalus, with associated right-sided ventriculoperitoneal shunt ending at the left lateral ventricle. 3. Moderate cortical volume loss and scattered small vessel ischemic microangiopathy. These results were called by telephone at the time of interpretation on 07/07/2017 at 10:39 pm to  [facility] PA, who verbally acknowledged these results. Electronically Signed   By: Garald Balding M.D.   On: 07/07/2017 22:40   Dg Hand Complete Left  Result Date: 07/07/2017 CLINICAL DATA:  Status post fall, with left hand injury. Initial encounter. EXAM: LEFT HAND - COMPLETE 3+ VIEW COMPARISON:  None. FINDINGS: There is no evidence of fracture or dislocation. Chronic subluxation is noted at the second and third metacarpophalangeal joints. 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 Mild degenerative change is noted at the first carpometacarpal joint. Subcortical cysts are noted at the proximal carpal row. The carpal rows are otherwise intact, and demonstrate normal alignment. The soft tissues are unremarkable in appearance. IMPRESSION: 1. No evidence of fracture or dislocation. 2. Erosive osteoarthritis noted. Electronically Signed   By: Garald Balding M.D.   On: 07/07/2017 22:09    Scheduled Meds: . buPROPion  150 mg Oral q morning - 10a  . mirabegron ER  50 mg Oral Daily  . tamsulosin  0.4 mg Oral Daily   Continuous Infusions: . sodium chloride 75 mL/hr at 07/08/17 1805     LOS: 1 day   Time spent: 25 minutes.  Vance Gather, MD [facility] Pager [phone]  If 7PM-7AM, please contact night-coverage [email or web address] Password [facility] 07/09/2017, 12:45 PM

## 2017-07-09 NOTE — Evaluation (Signed)
Physical Therapy Evaluation Patient Details Name: Mitchell [name] MRN: [id number] DOB: [birth year] Today's Date: 07/09/2017   History of Present Illness  83 yo male admitted with fall, SDH, L hand pain (-) fx. Hx of NPH, dementia.   Clinical Impression  On eval, pt required Mod assist +2 for mobility. He walked ~5 feet in room with a RW. HR up to 138 bpm with minimal activity. Pt presents with general weakness, decreased activity tolerance, and impaired gait and balance. Pt is currently at high risk for further falls. No family present during eval. Recommend SNF for continued rehab.     Follow Up Recommendations SNF    Equipment Recommendations  None recommended by PT    Recommendations for Other Services       Precautions / Restrictions Precautions Precautions: Fall Precaution Comments: high fall risk Other Brace/Splint: L hand with ace wrap Restrictions Weight Bearing Restrictions: No      Mobility  Bed Mobility Overal bed mobility: Needs Assistance Bed Mobility: Supine to Sit     Supine to sit: HOB elevated;Mod assist     General bed mobility comments: Assist for trunk and to scoot to EOB. Increased time. Cues for technique. Utilized bedpad to aid with scooting.   Transfers Overall transfer level: Needs assistance Equipment used: Rolling walker (2 wheeled) Transfers: Sit to/from Stand Sit to Stand: Mod assist;+2 physical assistance;+2 safety/equipment;From elevated surface         General transfer comment: Assist to rise, stabilize, control descent. Very unsteady. High fall risk.   Ambulation/Gait Ambulation/Gait assistance: Mod assist;+2 physical assistance;+2 safety/equipment Ambulation Distance (Feet): 5 Feet Assistive device: Rolling walker (2 wheeled) Gait Pattern/deviations: Decreased stride length;Decreased step length - right;Decreased step length - left;Trunk flexed     General Gait Details: High fall risk. Assist to stabilize pt and maneuver with  RW. HR up to 138 bpm so amb distance limited.   Stairs            Wheelchair Mobility    Modified Rankin (Stroke Patients Only)       Balance Overall balance assessment: Needs assistance;History of Falls         Standing balance support: Bilateral upper extremity supported Standing balance-Leahy Scale: Poor                               Pertinent Vitals/Pain Pain Assessment: No/denies pain    Home Living Family/patient expects to be discharged to:: Skilled nursing facility Living Arrangements: Spouse/significant other Available Help at Discharge: Family           Home Equipment: Gilford Rile - 2 wheels;Cane - single point      Prior Function Level of Independence: Independent with assistive device(s)               Hand Dominance        Extremity/Trunk Assessment   Upper Extremity Assessment Upper Extremity Assessment: Defer to OT evaluation    Lower Extremity Assessment Lower Extremity Assessment: Generalized weakness    Cervical / Trunk Assessment Cervical / Trunk Assessment: Kyphotic  Communication   Communication: HOH  Cognition Arousal/Alertness: Awake/alert Behavior During Therapy: WFL for tasks assessed/performed Overall Cognitive Status: History of cognitive impairments - at baseline  General Comments      Exercises     Assessment/Plan    PT Assessment Patient needs continued PT services  PT Problem List Decreased strength;Decreased mobility;Decreased activity tolerance;Decreased balance;Decreased knowledge of use of DME;Decreased cognition       PT Treatment Interventions DME instruction;Therapeutic activities;Therapeutic exercise;Gait training;Patient/family education;Balance training;Functional mobility training    PT Goals (Current goals can be found in the Care Plan section)  Acute Rehab PT Goals Patient Stated Goal: to be able to walk again PT Goal  Formulation: With patient Time For Goal Achievement: 07/23/17 Potential to Achieve Goals: Good    Frequency Min 2X/week   Barriers to discharge        Co-evaluation               AM-PAC PT "6 Clicks" Daily Activity  Outcome Measure Difficulty turning over in bed (including adjusting bedclothes, sheets and blankets)?: Total Difficulty moving from lying on back to sitting on the side of the bed? : Total Difficulty sitting down on and standing up from a chair with arms (e.g., wheelchair, bedside commode, etc,.)?: Total Help needed moving to and from a bed to chair (including a wheelchair)?: A Lot Help needed walking in hospital room?: A Lot Help needed climbing 3-5 steps with a railing? : A Lot 6 Click Score: 9    End of Session Equipment Utilized During Treatment: Gait belt Activity Tolerance: Patient limited by fatigue Patient left: in chair;with call bell/phone within reach;with family/visitor present Nurse Communication:  (made NT aware of +2 assist, condom cath off, no chair alarm since pt has sitter) PT Visit Diagnosis: Difficulty in walking, not elsewhere classified (R26.2);Muscle weakness (generalized) (M62.81)    Time: 2353-6144 PT Time Calculation (min) (ACUTE ONLY): 20 min   Charges:   PT Evaluation $PT Eval Low Complexity: 1 Low     PT G Codes:          Weston Anna, MPT Pager: [phone]

## 2017-07-09 NOTE — NC FL2 (Signed)
  [place] MEDICAID FL2 LEVEL OF CARE SCREENING TOOL     IDENTIFICATION  Patient Name: Mitchell [name] Birthdate: 10-18-1925 Sex: male Admission Date (Current Location): 07/07/2017  [facility] and [place] Number:  Herbalist and Address:  [facility],  [place], [place]      Provider Number: [id number]  Attending Physician Name and Address:  Patrecia Pour, MD  Relative Name and Phone Number:       Current Level of Care: Hospital Recommended Level of Care: [place] Prior Approval Number:    Date Approved/Denied:   PASRR Number: [id number]  Discharge Plan: SNF    Current Diagnoses: Patient Active Problem List   Diagnosis Date Noted  . SDH (subdural hematoma) ([facility]) 07/08/2017  . Pressure injury of skin 07/08/2017  . Subdural hematoma (HCC) 07/08/2017    Orientation RESPIRATION BLADDER Height & Weight     Self, Situation, Place  Normal Incontinent Weight: 162 lb 11.2 oz (73.8 kg) Height:  5\' 8"  (172.7 cm)  BEHAVIORAL SYMPTOMS/MOOD NEUROLOGICAL BOWEL NUTRITION STATUS  Other (Comment) (becomes irritable with caregivers at times)   Continent Diet (regular diet fluid consistency thin)  AMBULATORY STATUS COMMUNICATION OF NEEDS Skin   Extensive Assist Verbally PU Stage and Appropriate Care  pressure injury stage I, buttocks, dressing type- moisture barrier  Laceration upper head, dressing type- gauze, closure- staples  Laceration left hand, dressing type- compression wrap, closure- sutures                     Personal Care Assistance Level of Assistance  Bathing, Feeding, Dressing Bathing Assistance: Limited assistance Feeding assistance: Independent Dressing Assistance: Independent     Functional Limitations Info  Sight, Hearing, Speech Sight Info: Adequate   Speech Info: Adequate    SPECIAL CARE FACTORS FREQUENCY  PT (By licensed PT), OT (By licensed OT)     PT Frequency: 5x OT Frequency: 5x             Contractures Contractures Info: Not present    Additional Factors Info  Code Status, Allergies Code Status Info: full Allergies Info: Moxifloxacin, Quinine Derivatives           Current Medications (07/09/2017):  This is the current hospital active medication list Current Facility-Administered Medications  Medication Dose Route Frequency Provider Last Rate Last Dose  . acetaminophen (TYLENOL) tablet 500 mg  500 mg Oral Q6H PRN Ivor Costa, MD      . ALPRAZolam Duanne Moron) tablet 0.125 mg  0.125 mg Oral TID Patrecia Pour, MD   0.125 mg at 07/09/17 1347  . buPROPion (WELLBUTRIN XL) 24 hr tablet 150 mg  150 mg Oral q morning - 10a Ivor Costa, MD   150 mg at 07/09/17 1114  . mirabegron ER (MYRBETRIQ) tablet 50 mg  50 mg Oral Daily Bonnell Public Tublu, MD   50 mg at 07/09/17 1114  . polyethylene glycol (MIRALAX / GLYCOLAX) packet 17 g  17 g Oral Daily PRN Bonnell Public Tublu, MD      . tamsulosin [facility]) capsule 0.4 mg  0.4 mg Oral Daily Ivor Costa, MD   0.4 mg at 07/09/17 1114     Discharge Medications: Please see discharge summary for a list of discharge medications.  Relevant Imaging Results:  Relevant Lab Results:   Additional Information SS [id number]  Nila Nephew, LCSW

## 2017-07-09 NOTE — Evaluation (Addendum)
Occupational Therapy Evaluation Patient Details Name: Mitchell [name] MRN: [id number] DOB: 02/04/1925 Today's Date: 07/09/2017    History of Present Illness 83 yo male admitted with fall, SDH, L hand pain (-) fx. Hx of NPH, dementia.    Clinical Impression   This 83 y/o M presents with the above. Pt currently requires ModA+2 for functional mobility and MaxA for LB ADLs. No family present this session to determine baseline ADL function. Increased HR this session with highest HR 138 with brief mobility, 118 at rest at end of session. Pt will benefit from continued Mitchell [name] services while in acute setting and recommend Pt receive further Mitchell [name] services in SNF setting prior to return home to maximize Pt's safety and independence with ADLs and functional mobility.     Follow Up Recommendations  SNF;Supervision/Assistance - 24 hour    Equipment Recommendations  Other (comment) (TBD in next venue of care )           Precautions / Restrictions Precautions Precautions: Fall Precaution Comments: high fall risk Other Brace/Splint: L hand with ace wrap Restrictions Weight Bearing Restrictions: No      Mobility Bed Mobility Overal bed mobility: Needs Assistance Bed Mobility: Supine to Sit     Supine to sit: HOB elevated;Mod assist     General bed mobility comments: Assist for trunk and to scoot to EOB. Increased time. Cues for technique. Utilized bedpad to aid with scooting.   Transfers Overall transfer level: Needs assistance Equipment used: Rolling walker (2 wheeled) Transfers: Sit to/from Stand Sit to Stand: Mod assist;+2 physical assistance;+2 safety/equipment;From elevated surface         General transfer comment: Assist to rise, stabilize, control descent. Very unsteady. High fall risk.     Balance Overall balance assessment: Needs assistance;History of Falls         Standing balance support: Bilateral upper extremity supported Standing balance-Leahy Scale: Poor                              ADL either performed or assessed with clinical judgement   ADL Overall ADL's : Needs assistance/impaired Eating/Feeding: Set up;Sitting   Grooming: Wash/dry face;Set up;Sitting   Upper Body Bathing: Minimal assistance;Sitting   Lower Body Bathing: Moderate assistance;+2 for physical assistance;+2 for safety/equipment;Sit to/from stand   Upper Body Dressing : Minimal assistance;Sitting   Lower Body Dressing: Maximal assistance;+2 for physical assistance;+2 for safety/equipment;Sit to/from stand;Cueing for safety   Toilet Transfer: Moderate assistance;+2 for physical assistance;+2 for safety/equipment;Stand-pivot;RW;BSC   Toileting- Clothing Manipulation and Hygiene: Moderate assistance;+2 for physical assistance;+2 for safety/equipment;Sit to/from stand       Functional mobility during ADLs: Moderate assistance;+2 for physical assistance;+2 for safety/equipment;Rolling walker General ADL Comments: HR monitored throughout session, highest HR level 138 after ambulating approx 5 feet to chair, returned to 118 at rest while seated in recliner                         Pertinent Vitals/Pain Pain Assessment: No/denies pain     Hand Dominance     Extremity/Trunk Assessment Upper Extremity Assessment Upper Extremity Assessment: Generalized weakness   Lower Extremity Assessment Lower Extremity Assessment: Defer to PT evaluation   Cervical / Trunk Assessment Cervical / Trunk Assessment: Kyphotic   Communication Communication Communication: HOH   Cognition Arousal/Alertness: Awake/alert Behavior During Therapy: WFL for tasks assessed/performed Overall Cognitive Status: History of cognitive impairments - at baseline  Home Living Family/patient expects to be discharged to:: Skilled nursing facility Living Arrangements: Spouse/significant other Available Help at  Discharge: Family                         Home Equipment: Gilford Rile - 2 wheels;Cane - single point          Prior Functioning/Environment Level of Independence: Independent with assistive device(s)        Comments: no family present to determine ADL PLOF        Mitchell [name] Problem List: Decreased strength;Decreased range of motion;Decreased activity tolerance;Decreased knowledge of use of DME or AE;Decreased safety awareness;Impaired balance (sitting and/or standing);Decreased cognition      Mitchell [name] Treatment/Interventions: Self-care/ADL training;DME and/or AE instruction;Balance training;Therapeutic activities;Therapeutic exercise;Energy conservation;Patient/family education    Mitchell [name] Goals(Current goals can be found in the care plan section) Acute Rehab Mitchell [name] Goals Patient Stated Goal: to be able to walk again Mitchell [name] Goal Formulation: With patient Time For Goal Achievement: 07/23/17 Potential to Achieve Goals: Good  Mitchell [name] Frequency: Min 2X/week                             AM-PAC PT "6 Clicks" Daily Activity     Outcome Measure Help from another person eating meals?: A Little Help from another person taking care of personal grooming?: A Little Help from another person toileting, which includes using toliet, bedpan, or urinal?: A Lot Help from another person bathing (including washing, rinsing, drying)?: A Lot Help from another person to put on and taking off regular upper body clothing?: A Little Help from another person to put on and taking off regular lower body clothing?: A Lot 6 Click Score: 15   End of Session Equipment Utilized During Treatment: Gait belt;Rolling walker Nurse Communication: Mobility status  Activity Tolerance: Patient tolerated treatment well Patient left: in chair;with call bell/phone within reach;with nursing/sitter in room Freight forwarder )  Mitchell [name] Visit Diagnosis: Unsteadiness on feet (R26.81);Muscle weakness (generalized) (M62.81)                Time:  2778-2423 Mitchell [name] Time Calculation (min): 19 min Charges:  Mitchell [name] General Charges $Mitchell [name] Visit: 1 Procedure Mitchell [name] Evaluation $Mitchell [name] Eval Low Complexity: 1 Procedure G-Codes:     Mitchell [name], Mitchell [name] Pager [phone] 07/09/2017   Mitchell [name] 07/09/2017, 1:05 PM

## 2017-07-09 NOTE — Clinical Social Work Note (Signed)
Clinical Social Work Assessment  Patient Details  Name: Mitchell [name] MRN: [id number] Date of Birth: 1925-02-27  Date of referral:  07/09/17               Reason for consult:  Facility Placement                Permission sought to share information with:  Family Supports Permission granted to share information::  Yes, Verbal Permission Granted  Name::     Wife Ship broker::     Relationship::     Contact Information:     Housing/Transportation Living arrangements for the past 2 months:  [place] of Information:  Patient, Medical Team, Spouse Patient Interpreter Needed:  None Criminal Activity/Legal Involvement Pertinent to Current Situation/Hospitalization:  No - Comment as needed Significant Relationships:  Adult Children, Spouse Lives with:  Spouse Do you feel safe going back to the place where you live?  Yes Need for family participation in patient care:  Yes (Comment) (pt with dementia- wife primary decision maker)  Care giving concerns:  Pt from home where he resides with his wife. Pt at baseline ambulates independently with a cane and "can feed and dress himself." Recent falls has prompted interest in [place] rehab- per wife, "He's falling because he's not been getting out of bed as much and getting weaker, so when he gets up he has these falls."  Wife notes that pt has dx of dementia and CSW noted "behavioral disturbance" in chart. Wife reports, "he has never been violent. He was getting very irritable, angry, and unpredictable moods with me. We went to see a geriatric doctor and he takes Wellbutrin and Xanax now and seems much better."  Social Worker assessment / plan:  CSW consulted for assistance with SNF placement.  Reviewed pt's information and met with pt and wife at bedside to discuss. Both interested in rehab with goal of "becoming stronger and coming back home." Wife reports family is willing to pay privately if pt does not meet 3 night inpatient  hospital stay of Medicare SNF requirement.  Provided SNF list for their review- they will research facilities and inform CSW of preferences.  Obtained PASSR, completed FL2, and referred to [facility] area SNFs for rehab.  Plan: SNF at DC- will follow up with bed offers.   Employment status:  Retired Forensic scientist:  Medicare PT Recommendations:  [place] / Referral to community resources:  [place]  Patient/Family's Response to care: Pt shows minimal response but is pleasant. Wife repeatedly expressed appreciation for care received.   Patient/Family's Understanding of and Emotional Response to Diagnosis, Current Treatment, and Prognosis:  Pt demonstrates understanding of his need for therapy and willingness to enter SNF. He does not respond when discussing other aspects of treatment. Wife demonstrates thorough understanding and both are emotionally accepting and positive  Emotional Assessment Appearance:  Appears stated age Attitude/Demeanor/Rapport:   (appropriate) Affect (typically observed):  Calm Orientation:  Oriented to Self, Oriented to Place, Oriented to Situation Alcohol / Substance use:  Not Applicable Psych involvement (Current and /or in the community):  No (Comment)  Discharge Needs  Concerns to be addressed:  Discharge Planning Concerns Readmission within the last 30 days:  No Current discharge risk:  Dependent with Mobility Barriers to Discharge:  Continued Medical Work up   [employer], LCSW 07/09/2017, 2:22 PM  [phone]

## 2017-07-10 DIAGNOSIS — I6202 Nontraumatic subacute subdural hemorrhage: Secondary | ICD-10-CM | POA: Diagnosis not present

## 2017-07-10 DIAGNOSIS — S0101XD Laceration without foreign body of scalp, subsequent encounter: Secondary | ICD-10-CM | POA: Diagnosis not present

## 2017-07-10 DIAGNOSIS — I6200 Nontraumatic subdural hemorrhage, unspecified: Secondary | ICD-10-CM

## 2017-07-10 DIAGNOSIS — F329 Major depressive disorder, single episode, unspecified: Secondary | ICD-10-CM | POA: Diagnosis not present

## 2017-07-10 DIAGNOSIS — M6281 Muscle weakness (generalized): Secondary | ICD-10-CM | POA: Diagnosis not present

## 2017-07-10 DIAGNOSIS — G912 (Idiopathic) normal pressure hydrocephalus: Secondary | ICD-10-CM | POA: Diagnosis not present

## 2017-07-10 MED ORDER — ALPRAZOLAM 0.25 MG PO TABS: 0.1250 mg | ORAL_TABLET | Freq: Three times a day (TID) | ORAL | 0 refills | Status: DC

## 2017-07-10 NOTE — Progress Notes (Signed)
Report given to [employer] from [employer].  [facility] here for transport.

## 2017-07-10 NOTE — Clinical Social Work Placement (Signed)
Pt discharging today to transfer to [facility] SNF for [place] rehab. Room 610B, report # [id number]  Pt will transport via [place] completed medical necessity form and will arrange transport  All information provided to facility via the [place]. Family at bedside (wife, daughters) aware and agreeable to plan.  See below for placement details  CLINICAL SOCIAL WORK PLACEMENT  NOTE  Date:  07/10/2017  Patient Details  Name: Mitchell [name] MRN: [id number] Date of Birth: 12-02-1924  Clinical Social Work is seeking post-discharge placement for this patient at the [place] level of care (*CSW will initial, date and re-position this form in  chart as items are completed):  Yes   Patient/family provided with [place] Work Department's list of facilities offering this level of care within the geographic area requested by the patient (or if unable, by the patient's family).  Yes   Patient/family informed of their freedom to choose among providers that offer the needed level of care, that participate in Medicare, Medicaid or managed care program needed by the patient, have an available bed and are willing to accept the patient.  Yes   Patient/family informed of [facility]'s ownership interest in [facility] and [facility], as well as of the fact that they are under no obligation to receive care at these facilities.  PASRR submitted to EDS on       PASRR number received on 07/09/17     Existing PASRR number confirmed on       FL2 transmitted to all facilities in geographic area requested by pt/family on 07/09/17     FL2 transmitted to all facilities within larger geographic area on       Patient informed that his/her managed care company has contracts with or will negotiate with certain facilities, including the following:        Yes   Patient/family informed of bed offers received.  Patient chooses bed at [facility]     Physician recommends and  patient chooses bed at [facility]    Patient to be transferred to [facility] on 07/10/17.  Patient to be transferred to facility by [facility]     Patient family notified on 07/10/17 of transfer.  Name of family member notified:  wife Wells Guiles and daughters Candiss Norse     PHYSICIAN       Additional Comment:    _______________________________________________ Nila Nephew, LCSW 07/10/2017, 3:39 PM  [phone]

## 2017-07-10 NOTE — Discharge Summary (Addendum)
Physician Discharge Summary  Mitchell [name]:[id number] DOB: October 26, 1925 DOA: 07/07/2017  PCP: [name], MD  Admit date: 07/07/2017 Discharge date: 07/10/2017  Admitted From: Home Disposition: [facility]   Recommendations for Outpatient Follow-up:  1. Follow up with PCP in 1-2 weeks 2. For scalp laceration: Will need staple removal between 10-14 days (placed 8/12) 3. For left hand laceration: Sutures will need removal 7 days (placed 8/12)  Home Health: N/A Equipment/Devices: Per SNF Discharge Condition: Stable CODE STATUS: Full Diet recommendation: Regular  Brief/Interim Summary: [name] an 83 y.o.malewith a medical history of NPH, dementia with agitation and urinary incontinence who was brought to the ED 8/11 after a fall. He's become increasingly irritable and agitated over the past 6 months, withdrawing, and more debilitated over the past several months, spending most of his time in a recliner. His geriatrician started wellbutrin and xanax 2 weeks prior which has improved his mood. He's had had 3 falls in the past 5 weeks, but couldn't get back up after this one. In the ED, a head CT revealed bilateral subdural hematomas, 77mm on the right and 1cm on the left without midline shift and no acute findings concerning VP shunt. This was discussed with neurosurgery, Dr. Ellene Route, who felt that there was no need for any surgery and could be managed conservatively. Family was given a take patient home however he was unable to get up out of bed which is unusual for him so he was admitted. Physical therapy has evaluated the patient, recommending SNF. Surveillance CT on 8/13 demonstrated improvement/resolution of subdural hematomas.   Discharge Diagnoses:  Principal Problem:   Subdural hematoma (HCC) Active Problems:   SDH (subdural hematoma) (HCC)   Pressure injury of skin  Traumatic subdural hematomas: Conservative management recommended by neurosurgery.  - Surveillance  CT ordered at ~36 hrs shows interval near complete resolution of the subacute right subdural hematoma and slight decreased size of the left subdural hematoma.  Scalp laceration: Undergoing normal wound healing s/p staples in ED 8/11.  - Will need staple removal between 10-14 days.   Debility and increased falls: Medications could be contributing (xanax, wellbutrin), though these have improved his mood and made him more mobile. - PT/OT recommending SNF for rehabilitation. - Will continue medications for now  NPH: Chronic, appears stable on neuroimaging.  - No inpatient intervention planned. - Continue outpatient management of VP shunt.  Urinary incontinence:  - Continue flomax. Discussed increased fall risk with pharmacy Re: myrbetriq. Also reviewed package insert. I do not believe this is a primary cause of his fall and have continued myrbetriq cautiously. No significant HTN.  Dementia with behavioral disturbance, depression, anxiety:  - Continue bupropion and scheduled very low dose xanax per family report TID.  - Monitor for sedation, as xanax is clearly a high risk medication in this elderly gentleman. Still, currently feel benefit has outweighed risk at this time.  Discharge Instructions  Allergies as of 07/10/2017      Reactions   Moxifloxacin    Quinine Derivatives Other (See Comments)   Could not walk      Medication List    TAKE these medications   acetaminophen 500 MG tablet Commonly known as:  TYLENOL Take 500 mg by mouth every 6 (six) hours as needed for mild pain or moderate pain.   ALPRAZolam 0.25 MG tablet Commonly known as:  XANAX Take 0.5 tablets (0.125 mg total) by mouth 3 (three) times daily. What changed:  when to take this  reasons to take this   aspirin 81 MG chewable tablet Chew 81 mg by mouth daily.   buPROPion 150 MG 24 hr tablet Commonly known as:  WELLBUTRIN XL Take 150 mg by mouth every morning.   CALCIUM PO Take 500 mg by mouth  daily.   Fish Oil 1000 MG Caps Take 1,000 mg by mouth daily.   multivitamin with minerals Tabs tablet Take 1 tablet by mouth daily.   MYRBETRIQ 50 MG Tb24 tablet Generic drug:  mirabegron ER Take 50 mg by mouth daily.   tamsulosin 0.4 MG Caps capsule Commonly known as:  FLOMAX Take 0.4 mg by mouth daily.       Contact information for follow-up providers    [name], MD. Schedule an appointment as soon as possible for a visit.   Specialty:  Internal Medicine Why:  after SNF Contact information: [address] [place] [address] [phone]            Contact information for after-discharge care    Destination    [facility] Follow up.   Specialty:  [place] information: [address] [phone]                 Allergies  Allergen Reactions  . Moxifloxacin   . Quinine Derivatives Other (See Comments)    Could not walk    Consultations:  Neurosurgery, Dr. Ellene Route by phone at admission  Procedures/Studies: Ct Head Wo Contrast  Result Date: 07/09/2017 CLINICAL DATA:  Intracranial hemorrhage, follow-up EXAM: CT HEAD WITHOUT CONTRAST TECHNIQUE: Contiguous axial images were obtained from the base of the skull through the vertex without intravenous contrast. COMPARISON:  07/07/2017 FINDINGS: Brain: The right subdural hematoma has nearly completely resolved. Slight decrease in the size of the subacute left subdural hematoma, measuring 8 mm compared to 9-10 mm previously. No midline shift. Note new area of hemorrhage. Right parietal VP shunt again noted, unchanged. No hydrocephalus. Stable atrophy and chronic microvascular disease. Vascular: No hyperdense vessel or unexpected calcification. Skull: No acute calvarial abnormality. Sinuses/Orbits: Visualized paranasal sinuses and mastoids clear. Orbital soft tissues unremarkable. Other: None IMPRESSION: Near complete resolution of the  subacute right subdural hematoma and slight decreased size of the left subdural hematoma as described above. Atrophy, chronic microvascular disease. Stable right parietal VP shunt and ventricular size. Electronically Signed   By: Rolm Baptise M.D.   On: 07/09/2017 12:51   Ct Head Wo Contrast  Result Date: 07/07/2017 CLINICAL DATA:  Status post fall, with laceration at the anterior aspect of the head. Initial encounter. EXAM: CT HEAD WITHOUT CONTRAST TECHNIQUE: Contiguous axial images were obtained from the base of the skull through the vertex without intravenous contrast. COMPARISON:  MRI of the brain performed 01/18/2005 FINDINGS: Brain: Subacute bilateral subdural hematomas are noted, measuring up to 5 mm on the right and 1.0 cm on the left. Hydrocephalus appears relatively stable, with associated right-sided ventriculoperitoneal shunt ending at the left lateral ventricle. Moderate cortical volume loss is noted. Mild cerebellar atrophy is noted. Scattered periventricular white matter change likely reflects small vessel ischemic microangiopathy. The brainstem and fourth ventricle are within normal limits. The basal ganglia are unremarkable in appearance. The cerebral hemispheres demonstrate grossly normal gray-white differentiation. No mass effect or midline shift is seen. Vascular: No hyperdense vessel or unexpected calcification. Skull: There is no evidence of fracture; visualized osseous structures are unremarkable in appearance. Sinuses/Orbits: The visualized portions of the orbits are within normal limits.  The paranasal sinuses and mastoid air cells are well-aerated. Other: No significant soft tissue abnormalities are seen. IMPRESSION: 1. Subacute bilateral subdural hematomas, measuring up to 5 mm on the right and 1.0 cm on the left. No significant midline shift seen. 2. Relatively stable appearance to hydrocephalus, with associated right-sided ventriculoperitoneal shunt ending at the left lateral  ventricle. 3. Moderate cortical volume loss and scattered small vessel ischemic microangiopathy. These results were called by telephone at the time of interpretation on 07/07/2017 at 10:39 pm to [facility] PA, who verbally acknowledged these results. Electronically Signed   By: Garald Balding M.D.   On: 07/07/2017 22:40   Dg Hand Complete Left  Result Date: 07/07/2017 CLINICAL DATA:  Status post fall, with left hand injury. Initial encounter. EXAM: LEFT HAND - COMPLETE 3+ VIEW COMPARISON:  None. FINDINGS: There is no evidence of fracture or dislocation. Chronic subluxation is noted at the second and third metacarpophalangeal joints. 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 Mild degenerative change is noted at the first carpometacarpal joint. Subcortical cysts are noted at the proximal carpal row. The carpal rows are otherwise intact, and demonstrate normal alignment. The soft tissues are unremarkable in appearance. IMPRESSION: 1. No evidence of fracture or dislocation. 2. Erosive osteoarthritis noted. Electronically Signed   By: Garald Balding M.D.   On: 07/07/2017 22:09   Subjective: Pt feels well, no events overnight. Denies focal weakness/numbness.   Discharge Exam: BP 127/82 (BP Location: Right Arm)   Pulse (!) 106   Temp 98.3 F (36.8 C) (Oral)   Resp 18   Ht 5\' 8"  (1.727 m)   Wt 73.8 kg (162 lb 11.2 oz)   SpO2 95%   BMI 24.74 kg/m   General: Pt is alert, awake, not in acute distress HEENT: Superior scalp laceration with 2 staples without active bleeding, significant edge separation, or significant erythema. Cardiovascular: RRR, no murmur, JVD or edema Respiratory: CTA bilaterally, no wheezing, no rhonchi Abdominal: Soft, NT, ND, bowel sounds + GU: Condom catheter in place. Extremities: Warm, no deformities. Clean dressing on left dorsal ulnar hand. Radial pulses 2+, cap refill brisk throughout, sensorimotor exam without deficits in LUE. Skin:  Scalp and hand wounds as above, scattered diffuse SebK's. No other significant wounds. Neuro: No focal neurological deficits.   Labs: Basic Metabolic Panel:  Recent Labs Lab 07/08/17 0200 07/09/17 0450  NA 139 140  K 4.4 4.2  CL 107 109  CO2 23 25  GLUCOSE 100* 124*  BUN 22* 17  CREATININE 1.31* 1.29*  CALCIUM 9.2 8.1*   Liver Function Tests:  Recent Labs Lab 07/09/17 0450  AST 18  ALT 12*  ALKPHOS 50  BILITOT 0.6  PROT 5.7*  ALBUMIN 2.9*   CBC:  Recent Labs Lab 07/08/17 0200  WBC 10.7*  NEUTROABS 7.8*  HGB 12.2*  HCT 36.7*  MCV 90.4  PLT 127*    Time coordinating discharge: Approximately 40 minutes  Vance Gather, MD  [facility] 07/10/2017, 11:44 AM Pager [phone]

## 2017-07-10 NOTE — Progress Notes (Addendum)
CSW following to assist with transfer to SNF at DC. Left voicemail for wife in attempt to provide SNF bed offers and plan for DC.  Sharren Bridge, MSW, LCSW Clinical Social Work 07/10/2017 [phone]  Received call back from pt's daughter Mitchell [name]. Provided bed offers. Family understanding that SNF rehab will be private pay as pt has not had qualifying 3 night inpatient stay.  Family to review facilities and will follow up with CSW.

## 2017-07-11 DIAGNOSIS — I6200 Nontraumatic subdural hemorrhage, unspecified: Secondary | ICD-10-CM | POA: Diagnosis not present

## 2017-07-11 DIAGNOSIS — M6281 Muscle weakness (generalized): Secondary | ICD-10-CM | POA: Diagnosis not present

## 2017-07-11 DIAGNOSIS — R2681 Unsteadiness on feet: Secondary | ICD-10-CM | POA: Diagnosis not present

## 2017-07-11 DIAGNOSIS — S0990XA Unspecified injury of head, initial encounter: Secondary | ICD-10-CM | POA: Diagnosis not present

## 2017-07-11 DIAGNOSIS — Z9181 History of falling: Secondary | ICD-10-CM | POA: Diagnosis not present

## 2017-07-11 DIAGNOSIS — R278 Other lack of coordination: Secondary | ICD-10-CM | POA: Diagnosis not present

## 2017-07-11 DIAGNOSIS — I6202 Nontraumatic subacute subdural hemorrhage: Secondary | ICD-10-CM | POA: Diagnosis not present

## 2017-07-11 DIAGNOSIS — S0101XD Laceration without foreign body of scalp, subsequent encounter: Secondary | ICD-10-CM | POA: Diagnosis not present

## 2017-07-12 DIAGNOSIS — R278 Other lack of coordination: Secondary | ICD-10-CM | POA: Diagnosis not present

## 2017-07-12 DIAGNOSIS — S0101XD Laceration without foreign body of scalp, subsequent encounter: Secondary | ICD-10-CM | POA: Diagnosis not present

## 2017-07-12 DIAGNOSIS — M6281 Muscle weakness (generalized): Secondary | ICD-10-CM | POA: Diagnosis not present

## 2017-07-12 DIAGNOSIS — I6202 Nontraumatic subacute subdural hemorrhage: Secondary | ICD-10-CM | POA: Diagnosis not present

## 2017-07-12 DIAGNOSIS — F329 Major depressive disorder, single episode, unspecified: Secondary | ICD-10-CM | POA: Diagnosis not present

## 2017-07-12 DIAGNOSIS — Z9181 History of falling: Secondary | ICD-10-CM | POA: Diagnosis not present

## 2017-07-12 DIAGNOSIS — R2681 Unsteadiness on feet: Secondary | ICD-10-CM | POA: Diagnosis not present

## 2017-07-13 DIAGNOSIS — R278 Other lack of coordination: Secondary | ICD-10-CM | POA: Diagnosis not present

## 2017-07-13 DIAGNOSIS — R2681 Unsteadiness on feet: Secondary | ICD-10-CM | POA: Diagnosis not present

## 2017-07-13 DIAGNOSIS — S0101XD Laceration without foreign body of scalp, subsequent encounter: Secondary | ICD-10-CM | POA: Diagnosis not present

## 2017-07-13 DIAGNOSIS — I6202 Nontraumatic subacute subdural hemorrhage: Secondary | ICD-10-CM | POA: Diagnosis not present

## 2017-07-13 DIAGNOSIS — M6281 Muscle weakness (generalized): Secondary | ICD-10-CM | POA: Diagnosis not present

## 2017-07-13 DIAGNOSIS — Z9181 History of falling: Secondary | ICD-10-CM | POA: Diagnosis not present

## 2017-07-16 DIAGNOSIS — M6281 Muscle weakness (generalized): Secondary | ICD-10-CM | POA: Diagnosis not present

## 2017-07-16 DIAGNOSIS — R2681 Unsteadiness on feet: Secondary | ICD-10-CM | POA: Diagnosis not present

## 2017-07-16 DIAGNOSIS — Z9181 History of falling: Secondary | ICD-10-CM | POA: Diagnosis not present

## 2017-07-16 DIAGNOSIS — S0101XD Laceration without foreign body of scalp, subsequent encounter: Secondary | ICD-10-CM | POA: Diagnosis not present

## 2017-07-16 DIAGNOSIS — I6202 Nontraumatic subacute subdural hemorrhage: Secondary | ICD-10-CM | POA: Diagnosis not present

## 2017-07-16 DIAGNOSIS — R278 Other lack of coordination: Secondary | ICD-10-CM | POA: Diagnosis not present

## 2017-07-17 DIAGNOSIS — Z9181 History of falling: Secondary | ICD-10-CM | POA: Diagnosis not present

## 2017-07-17 DIAGNOSIS — I6202 Nontraumatic subacute subdural hemorrhage: Secondary | ICD-10-CM | POA: Diagnosis not present

## 2017-07-17 DIAGNOSIS — R278 Other lack of coordination: Secondary | ICD-10-CM | POA: Diagnosis not present

## 2017-07-17 DIAGNOSIS — R2681 Unsteadiness on feet: Secondary | ICD-10-CM | POA: Diagnosis not present

## 2017-07-17 DIAGNOSIS — M6281 Muscle weakness (generalized): Secondary | ICD-10-CM | POA: Diagnosis not present

## 2017-07-17 DIAGNOSIS — S0101XD Laceration without foreign body of scalp, subsequent encounter: Secondary | ICD-10-CM | POA: Diagnosis not present

## 2017-07-18 DIAGNOSIS — I6202 Nontraumatic subacute subdural hemorrhage: Secondary | ICD-10-CM | POA: Diagnosis not present

## 2017-07-18 DIAGNOSIS — M6281 Muscle weakness (generalized): Secondary | ICD-10-CM | POA: Diagnosis not present

## 2017-07-18 DIAGNOSIS — R2681 Unsteadiness on feet: Secondary | ICD-10-CM | POA: Diagnosis not present

## 2017-07-18 DIAGNOSIS — Z9181 History of falling: Secondary | ICD-10-CM | POA: Diagnosis not present

## 2017-07-18 DIAGNOSIS — S0101XD Laceration without foreign body of scalp, subsequent encounter: Secondary | ICD-10-CM | POA: Diagnosis not present

## 2017-07-18 DIAGNOSIS — R278 Other lack of coordination: Secondary | ICD-10-CM | POA: Diagnosis not present

## 2017-07-19 DIAGNOSIS — I6202 Nontraumatic subacute subdural hemorrhage: Secondary | ICD-10-CM | POA: Diagnosis not present

## 2017-07-19 DIAGNOSIS — R2681 Unsteadiness on feet: Secondary | ICD-10-CM | POA: Diagnosis not present

## 2017-07-19 DIAGNOSIS — S0101XD Laceration without foreign body of scalp, subsequent encounter: Secondary | ICD-10-CM | POA: Diagnosis not present

## 2017-07-19 DIAGNOSIS — R278 Other lack of coordination: Secondary | ICD-10-CM | POA: Diagnosis not present

## 2017-07-19 DIAGNOSIS — Z9181 History of falling: Secondary | ICD-10-CM | POA: Diagnosis not present

## 2017-07-19 DIAGNOSIS — M6281 Muscle weakness (generalized): Secondary | ICD-10-CM | POA: Diagnosis not present

## 2017-07-20 DIAGNOSIS — R2681 Unsteadiness on feet: Secondary | ICD-10-CM | POA: Diagnosis not present

## 2017-07-20 DIAGNOSIS — I6202 Nontraumatic subacute subdural hemorrhage: Secondary | ICD-10-CM | POA: Diagnosis not present

## 2017-07-20 DIAGNOSIS — R278 Other lack of coordination: Secondary | ICD-10-CM | POA: Diagnosis not present

## 2017-07-20 DIAGNOSIS — Z9181 History of falling: Secondary | ICD-10-CM | POA: Diagnosis not present

## 2017-07-20 DIAGNOSIS — M6281 Muscle weakness (generalized): Secondary | ICD-10-CM | POA: Diagnosis not present

## 2017-07-20 DIAGNOSIS — S0101XD Laceration without foreign body of scalp, subsequent encounter: Secondary | ICD-10-CM | POA: Diagnosis not present

## 2017-07-23 DIAGNOSIS — M6281 Muscle weakness (generalized): Secondary | ICD-10-CM | POA: Diagnosis not present

## 2017-07-23 DIAGNOSIS — R2681 Unsteadiness on feet: Secondary | ICD-10-CM | POA: Diagnosis not present

## 2017-07-23 DIAGNOSIS — S0101XD Laceration without foreign body of scalp, subsequent encounter: Secondary | ICD-10-CM | POA: Diagnosis not present

## 2017-07-23 DIAGNOSIS — Z9181 History of falling: Secondary | ICD-10-CM | POA: Diagnosis not present

## 2017-07-23 DIAGNOSIS — I6202 Nontraumatic subacute subdural hemorrhage: Secondary | ICD-10-CM | POA: Diagnosis not present

## 2017-07-23 DIAGNOSIS — R278 Other lack of coordination: Secondary | ICD-10-CM | POA: Diagnosis not present

## 2017-07-24 DIAGNOSIS — I6202 Nontraumatic subacute subdural hemorrhage: Secondary | ICD-10-CM | POA: Diagnosis not present

## 2017-07-24 DIAGNOSIS — Z9181 History of falling: Secondary | ICD-10-CM | POA: Diagnosis not present

## 2017-07-24 DIAGNOSIS — R278 Other lack of coordination: Secondary | ICD-10-CM | POA: Diagnosis not present

## 2017-07-24 DIAGNOSIS — S0101XD Laceration without foreign body of scalp, subsequent encounter: Secondary | ICD-10-CM | POA: Diagnosis not present

## 2017-07-24 DIAGNOSIS — R2681 Unsteadiness on feet: Secondary | ICD-10-CM | POA: Diagnosis not present

## 2017-07-24 DIAGNOSIS — M6281 Muscle weakness (generalized): Secondary | ICD-10-CM | POA: Diagnosis not present

## 2017-07-25 DIAGNOSIS — M6281 Muscle weakness (generalized): Secondary | ICD-10-CM | POA: Diagnosis not present

## 2017-07-25 DIAGNOSIS — R278 Other lack of coordination: Secondary | ICD-10-CM | POA: Diagnosis not present

## 2017-07-25 DIAGNOSIS — Z9181 History of falling: Secondary | ICD-10-CM | POA: Diagnosis not present

## 2017-07-25 DIAGNOSIS — S0101XD Laceration without foreign body of scalp, subsequent encounter: Secondary | ICD-10-CM | POA: Diagnosis not present

## 2017-07-25 DIAGNOSIS — R2681 Unsteadiness on feet: Secondary | ICD-10-CM | POA: Diagnosis not present

## 2017-07-25 DIAGNOSIS — I6202 Nontraumatic subacute subdural hemorrhage: Secondary | ICD-10-CM | POA: Diagnosis not present

## 2017-07-26 DIAGNOSIS — M6281 Muscle weakness (generalized): Secondary | ICD-10-CM | POA: Diagnosis not present

## 2017-07-26 DIAGNOSIS — S0101XD Laceration without foreign body of scalp, subsequent encounter: Secondary | ICD-10-CM | POA: Diagnosis not present

## 2017-07-26 DIAGNOSIS — R2681 Unsteadiness on feet: Secondary | ICD-10-CM | POA: Diagnosis not present

## 2017-07-26 DIAGNOSIS — R278 Other lack of coordination: Secondary | ICD-10-CM | POA: Diagnosis not present

## 2017-07-26 DIAGNOSIS — Z9181 History of falling: Secondary | ICD-10-CM | POA: Diagnosis not present

## 2017-07-26 DIAGNOSIS — I6202 Nontraumatic subacute subdural hemorrhage: Secondary | ICD-10-CM | POA: Diagnosis not present

## 2017-07-27 DIAGNOSIS — R278 Other lack of coordination: Secondary | ICD-10-CM | POA: Diagnosis not present

## 2017-07-27 DIAGNOSIS — Z9181 History of falling: Secondary | ICD-10-CM | POA: Diagnosis not present

## 2017-07-27 DIAGNOSIS — S0101XD Laceration without foreign body of scalp, subsequent encounter: Secondary | ICD-10-CM | POA: Diagnosis not present

## 2017-07-27 DIAGNOSIS — M6281 Muscle weakness (generalized): Secondary | ICD-10-CM | POA: Diagnosis not present

## 2017-07-27 DIAGNOSIS — R2681 Unsteadiness on feet: Secondary | ICD-10-CM | POA: Diagnosis not present

## 2017-07-27 DIAGNOSIS — I6202 Nontraumatic subacute subdural hemorrhage: Secondary | ICD-10-CM | POA: Diagnosis not present

## 2017-07-31 ENCOUNTER — Emergency Department (HOSPITAL_COMMUNITY): Payer: Medicare Other

## 2017-07-31 ENCOUNTER — Encounter (HOSPITAL_COMMUNITY): Payer: Self-pay | Admitting: Emergency Medicine

## 2017-07-31 ENCOUNTER — Inpatient Hospital Stay (HOSPITAL_COMMUNITY)
Admission: EM | Admit: 2017-07-31 | Discharge: 2017-08-02 | DRG: 683 | Disposition: A | Discharge status: Skilled Nursing Facility | Payer: Medicare Other | Attending: Internal Medicine | Admitting: Internal Medicine

## 2017-07-31 DIAGNOSIS — A419 Sepsis, unspecified organism: Secondary | ICD-10-CM | POA: Diagnosis present

## 2017-07-31 DIAGNOSIS — Z79899 Other long term (current) drug therapy: Secondary | ICD-10-CM

## 2017-07-31 DIAGNOSIS — I959 Hypotension, unspecified: Secondary | ICD-10-CM | POA: Diagnosis not present

## 2017-07-31 DIAGNOSIS — R000 Tachycardia, unspecified: Secondary | ICD-10-CM | POA: Diagnosis not present

## 2017-07-31 DIAGNOSIS — N179 Acute kidney failure, unspecified: Secondary | ICD-10-CM | POA: Diagnosis not present

## 2017-07-31 DIAGNOSIS — R296 Repeated falls: Secondary | ICD-10-CM | POA: Diagnosis present

## 2017-07-31 DIAGNOSIS — R42 Dizziness and giddiness: Secondary | ICD-10-CM

## 2017-07-31 DIAGNOSIS — F17210 Nicotine dependence, cigarettes, uncomplicated: Secondary | ICD-10-CM | POA: Diagnosis present

## 2017-07-31 DIAGNOSIS — R0602 Shortness of breath: Secondary | ICD-10-CM | POA: Diagnosis not present

## 2017-07-31 DIAGNOSIS — G912 (Idiopathic) normal pressure hydrocephalus: Secondary | ICD-10-CM | POA: Diagnosis present

## 2017-07-31 DIAGNOSIS — B9620 Unspecified Escherichia coli [E. coli] as the cause of diseases classified elsewhere: Secondary | ICD-10-CM | POA: Diagnosis present

## 2017-07-31 DIAGNOSIS — E860 Dehydration: Secondary | ICD-10-CM | POA: Diagnosis present

## 2017-07-31 DIAGNOSIS — Z982 Presence of cerebrospinal fluid drainage device: Secondary | ICD-10-CM

## 2017-07-31 DIAGNOSIS — I471 Supraventricular tachycardia: Secondary | ICD-10-CM | POA: Diagnosis not present

## 2017-07-31 DIAGNOSIS — Z888 Allergy status to other drugs, medicaments and biological substances status: Secondary | ICD-10-CM

## 2017-07-31 DIAGNOSIS — Z881 Allergy status to other antibiotic agents status: Secondary | ICD-10-CM

## 2017-07-31 DIAGNOSIS — N390 Urinary tract infection, site not specified: Secondary | ICD-10-CM | POA: Diagnosis not present

## 2017-07-31 DIAGNOSIS — N400 Enlarged prostate without lower urinary tract symptoms: Secondary | ICD-10-CM | POA: Diagnosis present

## 2017-07-31 DIAGNOSIS — M1990 Unspecified osteoarthritis, unspecified site: Secondary | ICD-10-CM | POA: Diagnosis present

## 2017-07-31 DIAGNOSIS — F0391 Unspecified dementia with behavioral disturbance: Secondary | ICD-10-CM | POA: Diagnosis present

## 2017-07-31 DIAGNOSIS — R404 Transient alteration of awareness: Secondary | ICD-10-CM | POA: Diagnosis not present

## 2017-07-31 DIAGNOSIS — N3000 Acute cystitis without hematuria: Secondary | ICD-10-CM | POA: Diagnosis not present

## 2017-07-31 DIAGNOSIS — Z7982 Long term (current) use of aspirin: Secondary | ICD-10-CM

## 2017-07-31 DIAGNOSIS — R32 Unspecified urinary incontinence: Secondary | ICD-10-CM | POA: Diagnosis present

## 2017-07-31 LAB — URINALYSIS, ROUTINE W REFLEX MICROSCOPIC
Bilirubin Urine: NEGATIVE
GLUCOSE, UA: NEGATIVE mg/dL
Ketones, ur: NEGATIVE mg/dL
NITRITE: NEGATIVE
PH: 6 (ref 5.0–8.0)
Protein, ur: 30 mg/dL — AB
SPECIFIC GRAVITY, URINE: 1.014 (ref 1.005–1.030)

## 2017-07-31 LAB — COMPREHENSIVE METABOLIC PANEL
ALK PHOS: 62 U/L (ref 38–126)
ALT: 19 U/L (ref 17–63)
ANION GAP: 7 (ref 5–15)
AST: 22 U/L (ref 15–41)
Albumin: 3 g/dL — ABNORMAL LOW (ref 3.5–5.0)
BILIRUBIN TOTAL: 0.5 mg/dL (ref 0.3–1.2)
BUN: 26 mg/dL — ABNORMAL HIGH (ref 6–20)
CALCIUM: 8.5 mg/dL — AB (ref 8.9–10.3)
CO2: 25 mmol/L (ref 22–32)
Chloride: 106 mmol/L (ref 101–111)
Creatinine, Ser: 1.45 mg/dL — ABNORMAL HIGH (ref 0.61–1.24)
GFR, EST AFRICAN AMERICAN: 48 mL/min — AB (ref >60)
GFR, EST NON AFRICAN AMERICAN: 41 mL/min — AB (ref >60)
Glucose, Bld: 135 mg/dL — ABNORMAL HIGH (ref 65–99)
POTASSIUM: 4 mmol/L (ref 3.5–5.1)
Sodium: 138 mmol/L (ref 135–145)
TOTAL PROTEIN: 6.5 g/dL (ref 6.5–8.1)

## 2017-07-31 LAB — CBC WITH DIFFERENTIAL/PLATELET
BASOS ABS: 0 10*3/uL (ref 0.0–0.1)
Basophils Relative: 0 %
EOS PCT: 2 %
Eosinophils Absolute: 0.2 10*3/uL (ref 0.0–0.7)
HEMATOCRIT: 39.1 % (ref 39.0–52.0)
Hemoglobin: 12.8 g/dL — ABNORMAL LOW (ref 13.0–17.0)
LYMPHS PCT: 8 %
Lymphs Abs: 1 10*3/uL (ref 0.7–4.0)
MCH: 28.8 pg (ref 26.0–34.0)
MCHC: 32.7 g/dL (ref 30.0–36.0)
MCV: 88.1 fL (ref 78.0–100.0)
Monocytes Absolute: 1.3 10*3/uL — ABNORMAL HIGH (ref 0.1–1.0)
Monocytes Relative: 10 %
NEUTROS ABS: 9.8 10*3/uL — AB (ref 1.7–7.7)
Neutrophils Relative %: 80 %
Platelets: 249 10*3/uL (ref 150–400)
RBC: 4.44 MIL/uL (ref 4.22–5.81)
RDW: 14 % (ref 11.5–15.5)
WBC: 12.3 10*3/uL — AB (ref 4.0–10.5)

## 2017-07-31 LAB — MRSA PCR SCREENING: MRSA BY PCR: NEGATIVE

## 2017-07-31 LAB — I-STAT TROPONIN, ED: Troponin i, poc: 0 ng/mL (ref 0.00–0.08)

## 2017-07-31 LAB — I-STAT CG4 LACTIC ACID, ED: Lactic Acid, Venous: 1.18 mmol/L (ref 0.5–1.9)

## 2017-07-31 MED ORDER — ACETAMINOPHEN 325 MG PO TABS: 650.0000 mg | ORAL_TABLET | Freq: Four times a day (QID) | ORAL | Status: DC | PRN

## 2017-07-31 MED ORDER — DEXTROSE 5 % IV SOLN
1.0000 g | Freq: Once | INTRAVENOUS | Status: AC
  Administered 2017-07-31: 1 g via INTRAVENOUS
  Filled 2017-07-31: qty 10

## 2017-07-31 MED ORDER — ONDANSETRON HCL 4 MG PO TABS: 4.0000 mg | ORAL_TABLET | Freq: Four times a day (QID) | ORAL | Status: DC | PRN

## 2017-07-31 MED ORDER — SODIUM CHLORIDE 0.9% FLUSH
3.0000 mL | Freq: Two times a day (BID) | INTRAVENOUS | Status: DC
  Administered 2017-08-01: 3 mL via INTRAVENOUS

## 2017-07-31 MED ORDER — DEXTROSE 5 % IV SOLN
1.0000 g | INTRAVENOUS | Status: DC
  Administered 2017-08-01: 1 g via INTRAVENOUS
  Filled 2017-07-31 (×2): qty 10

## 2017-07-31 MED ORDER — ALPRAZOLAM 0.25 MG PO TABS
0.1250 mg | ORAL_TABLET | Freq: Three times a day (TID) | ORAL | Status: DC
  Administered 2017-07-31: 0.125 mg via ORAL
  Filled 2017-07-31: qty 1

## 2017-07-31 MED ORDER — SODIUM CHLORIDE 0.9 % IV BOLUS (SEPSIS)
1000.0000 mL | Freq: Once | INTRAVENOUS | Status: AC
  Administered 2017-07-31: 1000 mL via INTRAVENOUS

## 2017-07-31 MED ORDER — SODIUM CHLORIDE 0.9 % IV SOLN
INTRAVENOUS | Status: DC
  Administered 2017-07-31 – 2017-08-01 (×2): via INTRAVENOUS

## 2017-07-31 MED ORDER — POLYETHYLENE GLYCOL 3350 17 G PO PACK: 17.0000 g | PACK | Freq: Every day | ORAL | Status: DC | PRN

## 2017-07-31 MED ORDER — ACETAMINOPHEN 650 MG RE SUPP: 650.0000 mg | Freq: Four times a day (QID) | RECTAL | Status: DC | PRN

## 2017-07-31 MED ORDER — ASPIRIN 81 MG PO CHEW
81.0000 mg | CHEWABLE_TABLET | Freq: Every day | ORAL | Status: DC
  Administered 2017-08-01 – 2017-08-02 (×2): 81 mg via ORAL
  Filled 2017-07-31 (×2): qty 1

## 2017-07-31 MED ORDER — MIRABEGRON ER 25 MG PO TB24
50.0000 mg | ORAL_TABLET | Freq: Every day | ORAL | Status: DC
  Administered 2017-08-01 – 2017-08-02 (×2): 50 mg via ORAL
  Filled 2017-07-31 (×2): qty 2

## 2017-07-31 MED ORDER — ONDANSETRON HCL 4 MG/2ML IJ SOLN: 4.0000 mg | Freq: Four times a day (QID) | INTRAMUSCULAR | Status: DC | PRN

## 2017-07-31 NOTE — ED Triage Notes (Signed)
Per EMS, patient from [facility], c/o dizziness and staff reports decreased BP during PT.  Hx subdural hematoma and dementia. A&Ox2. At baseline per staff.  BP 136/91  HR 119  O2 96% RA  CBG 121  18g L FA

## 2017-07-31 NOTE — ED Notes (Signed)
Family at bedside. 

## 2017-07-31 NOTE — H&P (Signed)
[facility] History and Physical   Patient: Mitchell [name]:[id number]   PCP: [name], MD DOB: Jun 22, 1925   DOA: 07/31/2017   DOS: 07/31/2017   DOS: the patient was seen and examined on 07/31/2017  Patient coming from: The patient is coming from SNF.  Chief Complaint: Dizziness  HPI: Mitchell [name] is a 83 y.o. male with Past medical history of normal pressure hydrocephalus, dementia with agitation, urinary incontinence, recurrent fall with subdural hematoma conservative management. Patient presented with dizziness. He was doing fine until yesterday. His morning when he was working with physical therapy have felt dizzy and lightheaded his blood pressure was running in 80s as well as heart rate was running faster. He was more lethargic and somewhat more confused and therefore he was brought to the hospital. At the time of my evaluation patient remained dizzy. No chest pain or shortness of breath no fever no chills. No palpitation. No diarrhea no constipation. No nausea no vomiting. No recent change in medication.  ED Course: Patient was given IV hydration and IV antibiotics. CT head unremarkable for any acute change, chronic subdural hematoma unchanged. Chest x-ray unremarkable. Lactic acid normal. Referred for admission due to mild acute kidney injury and possible UTI  At his baseline ambulates with support And is dependent for most of his ADL; does not manages his medication on his own.  Review of Systems: as mentioned in the history of present illness.  All other systems reviewed and are negative.  Past Medical History:  Diagnosis Date  . Arthritis   . BPH (benign prostatic hyperplasia)   . Calcium nephrolithiasis   . Cataract   . Compression fracture of L2 lumbar vertebra (HCC)    and L4  . Inguinal hernia   . Osteoarthritis   . Spondylosis   . Subdural hematoma (HCC)    History reviewed. No pertinent surgical history. Social History:  reports that he has been  smoking.  He has never used smokeless tobacco. He reports that he does not drink alcohol. His drug history is not on file.  Allergies  Allergen Reactions  . Moxifloxacin   . Quinine Derivatives Other (See Comments)    Could not walk     History reviewed. No pertinent family history.   Prior to Admission medications   Medication Sig Start Date End Date Taking? Authorizing Provider  acetaminophen (TYLENOL) 500 MG tablet Take 500 mg by mouth every 6 (six) hours as needed for mild pain or moderate pain.   Yes [provider]  ALPRAZolam (XANAX) 0.25 MG tablet Take 0.5 tablets (0.125 mg total) by mouth 3 (three) times daily. 07/10/17  Yes Patrecia Pour, MD  aspirin 81 MG chewable tablet Chew 81 mg by mouth daily.   Yes [provider]  buPROPion (WELLBUTRIN XL) 150 MG 24 hr tablet Take 150 mg by mouth every morning. 06/29/17  Yes [provider]  calcium carbonate (OS-CAL - DOSED IN MG OF ELEMENTAL CALCIUM) 1250 (500 Ca) MG tablet Take 1 tablet by mouth.   Yes [provider]  Multiple Vitamin (MULTIVITAMIN WITH MINERALS) TABS tablet Take 1 tablet by mouth daily.   Yes [provider]  MYRBETRIQ 50 MG TB24 tablet Take 50 mg by mouth daily. 06/07/17  Yes [provider]  Omega-3 Fatty Acids (FISH OIL) 1000 MG CAPS Take 1,000 mg by mouth daily.   Yes [provider]  tamsulosin (FLOMAX) 0.4 MG CAPS capsule Take 0.4 mg by mouth daily. 06/07/17  Yes [provider]    Physical Exam: Vitals:   07/31/17 1305 07/31/17 1457 07/31/17 1530 07/31/17 1722  BP: 138/86  (!) 145/94 (!) 153/99  Pulse: (!) 118  (!) 107 (!) 110  Resp: (!) 22  (!) 26 18  Temp: 98.7 F (37.1 C) 98.3 F (36.8 C)  97.6 F (36.4 C)  TempSrc: Oral Rectal  Oral  SpO2: 97%  96% 100%  Weight:    71.1 kg (156 lb 12 oz)  Height:    5\' 8"  (1.727 m)    General: Alert, Awake and Oriented to Time, Place and Person. Appear in mild distress, affect  appropriate Eyes: PERRL, Conjunctiva normal ENT: Oral Mucosa clear dry. Neck: no JVD, no Abnormal Mass Or lumps Cardiovascular: S1 and S2 Present, no Murmur, Peripheral Pulses Present Respiratory: normal respiratory effort, Bilateral Air entry equal and Decreased, no use of accessory muscle, Clear to Auscultation, no Crackles, no wheezes Abdomen: Bowel Sound present, Soft and no tenderness, no hernia Skin: no redness, no Rash, no induration Extremities: no Pedal edema, no calf tenderness Neurologic: Grossly no focal neuro deficit. Bilaterally Equal motor strength  Labs on Admission:  CBC:  Recent Labs Lab 07/31/17 1330  WBC 12.3*  NEUTROABS 9.8*  HGB 12.8*  HCT 39.1  MCV 88.1  PLT 379   Basic Metabolic Panel:  Recent Labs Lab 07/31/17 1330  NA 138  K 4.0  CL 106  CO2 25  GLUCOSE 135*  BUN 26*  CREATININE 1.45*  CALCIUM 8.5*   GFR: Estimated Creatinine Clearance: 33.4 mL/min (A) (by C-G formula based on SCr of 1.45 mg/dL (H)). Liver Function Tests:  Recent Labs Lab 07/31/17 1330  AST 22  ALT 19  ALKPHOS 62  BILITOT 0.5  PROT 6.5  ALBUMIN 3.0*   No results for input(s): LIPASE, AMYLASE in the last 168 hours. No results for input(s): AMMONIA in the last 168 hours. Coagulation Profile: No results for input(s): INR, PROTIME in the last 168 hours. Cardiac Enzymes: No results for input(s): CKTOTAL, CKMB, CKMBINDEX, TROPONINI in the last 168 hours. BNP (last 3 results) No results for input(s): PROBNP in the last 8760 hours. HbA1C: No results for input(s): HGBA1C in the last 72 hours. CBG: No results for input(s): GLUCAP in the last 168 hours. Lipid Profile: No results for input(s): CHOL, HDL, LDLCALC, TRIG, CHOLHDL, LDLDIRECT in the last 72 hours. Thyroid Function Tests: No results for input(s): TSH, T4TOTAL, FREET4, T3FREE, THYROIDAB in the last 72 hours. Anemia Panel: No results for input(s): VITAMINB12, FOLATE, FERRITIN, TIBC, IRON, RETICCTPCT in the  last 72 hours. Urine analysis:    Component Value Date/Time   COLORURINE YELLOW 07/31/2017 1344   APPEARANCEUR CLOUDY (A) 07/31/2017 1344   LABSPEC 1.014 07/31/2017 1344   PHURINE 6.0 07/31/2017 1344   GLUCOSEU NEGATIVE 07/31/2017 1344   HGBUR MODERATE (A) 07/31/2017 1344   BILIRUBINUR NEGATIVE 07/31/2017 1344   KETONESUR NEGATIVE 07/31/2017 1344   PROTEINUR 30 (A) 07/31/2017 1344   NITRITE NEGATIVE 07/31/2017 1344   LEUKOCYTESUR LARGE (A) 07/31/2017 1344    Radiological Exams on Admission: Dg Chest 2 View  Result Date: 07/31/2017 CLINICAL DATA:  Weak dizzy and shortness of breath EXAM: CHEST  2 VIEW COMPARISON:  Report 01/14/2001 FINDINGS: Elevation of the left diaphragm with basilar atelectasis. No pleural effusion or focal consolidation is seen. Right-sided shunt catheter. Cardiomediastinal silhouette within normal limits. Aortic atherosclerosis. No pneumothorax. IMPRESSION: 1. Elevated left diaphragm with basilar atelectasis 2. No focal consolidation Electronically Signed  By: Donavan Foil M.D.   On: 07/31/2017 14:18   Ct Head Wo Contrast  Result Date: 07/31/2017 CLINICAL DATA:  Dizziness. Dementia. History of previous subdural hematomas EXAM: CT HEAD WITHOUT CONTRAST TECHNIQUE: Contiguous axial images were obtained from the base of the skull through the vertex without intravenous contrast. COMPARISON:  July 09, 2017 and July 07, 2017 FINDINGS: Brain: The previously noted subacute appearing left parietal subdural hematoma currently measures 8.5 mm in maximum thickness, similar compared to most recent studies. Subacute appearing left frontal subdural hematoma currently measures 5 mm in maximum thickness, stable. Previously noted subacute right frontal subdural hematoma measures 4 mm in thickness, essentially stable. There are no new extra-axial fluid collections. There is no acute appearing intra-axial or extra-axial hemorrhage. No midline shift. The degree of mass effect on the right  parietal lobe is mild and stable. No new mass-effect seen on either side. Ventricles are moderately enlarged. Shunt catheter tip is in the frontal horn left lateral ventricular region, stable. There is no intra-axial mass or edema. There is encephalomalacia along the shunt catheter tract in the anterior right parietal lobe, stable. There is small vessel disease in the centra semiovale bilaterally, stable. No new gray-white compartment lesion is evident. There is no evident acute infarct. Vascular: There is no hyperdense vessel. There is calcification in each carotid siphon. Skull: There is a defect from shunt catheter placement in the posterior right temporal bone. Bony structures otherwise appear intact. Sinuses/Orbits: There is mucosal thickening in several ethmoid air cells. Other visualized paranasal sinuses are clear. Orbits appear symmetric bilaterally. Other: Mastoid air cells are clear. There is debris in each external auditory canal. IMPRESSION: 1. Subacute hematomas bilaterally are essentially stable. Hematoma is somewhat larger on the left than on the right in the parietal region with maximum thickness slightly greater than 8 mm. There is localized mass effect in this area, similar to recent prior studies. No midline shift. 2. No intra-axial or extra-axial acute appearing hemorrhage. There is ventricular prominence with shunt catheter present, stable. There is patchy small vessel disease in the centra semiovale bilaterally. No acute appearing infarct evident. 3.  There are multiple foci of arterial vascular calcification. 4.  Areas of ethmoid sinus disease bilaterally. 5.  Probable cerumen in each external auditory canal. Electronically Signed   By: Lowella Grip III M.D.   On: 07/31/2017 14:07   EKG: Independently reviewed. sinus tachycardia.  Assessment/Plan 1. Dizziness and lightheadedness. Sinus tachycardia. Hypotension. UTI. Dehydration. Presents with complains of dizziness and  lightheadedness, blood pressure was in 80s, currently tachycardic. Blood pressure has improved after receiving IV fluid bolus. Appears to have a UTI. Mild acute kidney injury also evident on labs. Will continue with IV hydration. Lactic acid normal but clinically patient appears dehydrated. Follow-up on culture. Continue IV ceftriaxone. PTOT and Education officer, museum consult.  2. Recurrent fall. History of NPH. Traumatic subdural hematoma. Conservative management recommended by neurosurgery. Patient does have a VP shunt which is managed clinically currently does not appear to be having any acute issues with that as well. CT head shows no significant change in subdural hematoma.  3. Urinary incontinence. Patient is on Flomax as well as on myrbetriq.  At present I would hold the Flomax and continue myrbetriq. With his NPH suspecting patient's primary issue is incontinence. Recommending bladder scan every 8 hours for now.  4. Dementia with beer disturbances. Depression Anxiety Continuing Xanax. Holding the Wellbutrin for now.  Nutrition: regular diet DVT Prophylaxis: mechanical compression device  Advance goals of care discussion: Full code. Patient wants his wife to be the decision maker for him. Wife wants to think about further change in goals of care.   Consults: none  Family Communication: family was present at bedside, at the time of interview.  Opportunity was given to ask question and all questions were answered satisfactorily.  Disposition: Admitted as observation, telemetry unit. Likely to be discharged to SNF, in 2 days.  Author: Berle Mull, MD [facility] Pager: [phone] 07/31/2017  If 7PM-7AM, please contact night-coverage [email or web address] Password [email or web address]

## 2017-07-31 NOTE — ED Notes (Signed)
Bed: WA08 Expected date:  Expected time:  Means of arrival:  Comments: EMS-dizzy

## 2017-07-31 NOTE — ED Notes (Signed)
ED TO INPATIENT HANDOFF REPORT  Name/Age/Gender Mitchell [name] 83 y.o. male  Code Status Code Status History    Date Active Date Inactive Code Status Order ID Comments User Context   07/08/2017  7:53 AM 07/10/2017 10:06 PM Full Code [id number]  Vashti Hey, MD Inpatient    Advance Directive Documentation     Most Recent Value  Type of Advance Directive  Healthcare Power of Attorney  Pre-existing out of facility DNR order (yellow form or pink MOST form)  -  "MOST" Form in Place?  -      Home/SNF/Other Nursing Home  Chief Complaint Dizziness  Level of Care/Admitting Diagnosis ED Disposition    ED Disposition Condition [place]: [facility] [100102]  Level of Care: Telemetry [5]  Admit to tele based on following criteria: Eval of Syncope  Diagnosis: UTI (urinary tract infection) [638453]  Admitting Physician: Lavina Hamman [[id number]]  Attending Physician: Lavina Hamman [[id number]]  PT Class (Do Not Modify): Observation [104]  PT Acc Code (Do Not Modify): Observation [10022]       Medical History Past Medical History:  Diagnosis Date  . Arthritis   . BPH (benign prostatic hyperplasia)   . Calcium nephrolithiasis   . Cataract   . Compression fracture of L2 lumbar vertebra (HCC)    and L4  . Inguinal hernia   . Osteoarthritis   . Spondylosis   . Subdural hematoma (HCC)     Allergies Allergies  Allergen Reactions  . Moxifloxacin   . Quinine Derivatives Other (See Comments)    Could not walk    IV Location/Drains/Wounds Patient Lines/Drains/Airways Status   Active Line/Drains/Airways    Name:   Placement date:   Placement time:   Site:   Days:   External Urinary Catheter  07/09/17    2154        22   Pressure Injury 07/08/17 Stage I -  Intact skin with non-blanchable redness of a localized area usually over a bony prominence.  07/08/17    0800      23   Wound / Incision (Open or Dehisced) 07/08/17  Laceration Head Upper  07/08/17    0713    Head    23   Wound / Incision (Open or Dehisced) 07/08/17 Laceration Hand Left  07/08/17    0714    Hand    23   Wound / Incision (Open or Dehisced) 07/08/17 Other (Comment) Breast Left red  07/08/17    0715    Breast    23   Wound / Incision (Open or Dehisced)                        Labs/Imaging Results for orders placed or performed during the hospital encounter of 07/31/17 (from the past 48 hour(s))  Comprehensive metabolic panel     Status: Abnormal   Collection Time: 07/31/17  1:30 PM  Result Value Ref Range   Sodium 138 135 - 145 mmol/L   Potassium 4.0 3.5 - 5.1 mmol/L   Chloride 106 101 - 111 mmol/L   CO2 25 22 - 32 mmol/L   Glucose, Bld 135 (H) 65 - 99 mg/dL   BUN 26 (H) 6 - 20 mg/dL   Creatinine, Ser 1.45 (H) 0.61 - 1.24 mg/dL   Calcium 8.5 (L) 8.9 - 10.3 mg/dL   Total Protein 6.5 6.5 - 8.1 g/dL   Albumin 3.0 (  L) 3.5 - 5.0 g/dL   AST 22 15 - 41 U/L   ALT 19 17 - 63 U/L   Alkaline Phosphatase 62 38 - 126 U/L   Total Bilirubin 0.5 0.3 - 1.2 mg/dL   GFR calc non Af Amer 41 (L) >60 mL/min   GFR calc Af Amer 48 (L) >60 mL/min    Comment: (NOTE) The eGFR has been calculated using the CKD EPI equation. This calculation has not been validated in all clinical situations. eGFR's persistently <60 mL/min signify possible Chronic Kidney Disease.    Anion gap 7 5 - 15  CBC with Differential     Status: Abnormal   Collection Time: 07/31/17  1:30 PM  Result Value Ref Range   WBC 12.3 (H) 4.0 - 10.5 K/uL   RBC 4.44 4.22 - 5.81 MIL/uL   Hemoglobin 12.8 (L) 13.0 - 17.0 g/dL   HCT 39.1 39.0 - 52.0 %   MCV 88.1 78.0 - 100.0 fL   MCH 28.8 26.0 - 34.0 pg   MCHC 32.7 30.0 - 36.0 g/dL   RDW 14.0 11.5 - 15.5 %   Platelets 249 150 - 400 K/uL   Neutrophils Relative % 80 %   Neutro Abs 9.8 (H) 1.7 - 7.7 K/uL   Lymphocytes Relative 8 %   Lymphs Abs 1.0 0.7 - 4.0 K/uL   Monocytes Relative 10 %   Monocytes Absolute 1.3 (H) 0.1 - 1.0 K/uL    Eosinophils Relative 2 %   Eosinophils Absolute 0.2 0.0 - 0.7 K/uL   Basophils Relative 0 %   Basophils Absolute 0.0 0.0 - 0.1 K/uL  Urinalysis, Routine w reflex microscopic     Status: Abnormal   Collection Time: 07/31/17  1:44 PM  Result Value Ref Range   Color, Urine YELLOW YELLOW   APPearance CLOUDY (A) CLEAR   Specific Gravity, Urine 1.014 1.005 - 1.030   pH 6.0 5.0 - 8.0   Glucose, UA NEGATIVE NEGATIVE mg/dL   Hgb urine dipstick MODERATE (A) NEGATIVE   Bilirubin Urine NEGATIVE NEGATIVE   Ketones, ur NEGATIVE NEGATIVE mg/dL   Protein, ur 30 (A) NEGATIVE mg/dL   Nitrite NEGATIVE NEGATIVE   Leukocytes, UA LARGE (A) NEGATIVE   RBC / HPF TOO NUMEROUS TO COUNT 0 - 5 RBC/hpf   WBC, UA TOO NUMEROUS TO COUNT 0 - 5 WBC/hpf   Bacteria, UA MANY (A) NONE SEEN   Squamous Epithelial / LPF 0-5 (A) NONE SEEN   WBC Clumps PRESENT    Mucus PRESENT   I-stat troponin, ED     Status: None   Collection Time: 07/31/17  2:01 PM  Result Value Ref Range   Troponin i, poc 0.00 0.00 - 0.08 ng/mL   Comment 3            Comment: Due to the release kinetics of cTnI, a negative result within the first hours of the onset of symptoms does not rule out myocardial infarction with certainty. If myocardial infarction is still suspected, repeat the test at appropriate intervals.   I-Stat CG4 Lactic Acid, ED     Status: None   Collection Time: 07/31/17  2:41 PM  Result Value Ref Range   Lactic Acid, Venous 1.18 0.5 - 1.9 mmol/L   Dg Chest 2 View  Result Date: 07/31/2017 CLINICAL DATA:  Weak dizzy and shortness of breath EXAM: CHEST  2 VIEW COMPARISON:  Report 01/14/2001 FINDINGS: Elevation of the left diaphragm with basilar atelectasis. No pleural effusion or focal  consolidation is seen. Right-sided shunt catheter. Cardiomediastinal silhouette within normal limits. Aortic atherosclerosis. No pneumothorax. IMPRESSION: 1. Elevated left diaphragm with basilar atelectasis 2. No focal consolidation Electronically  Signed   By: Donavan Foil M.D.   On: 07/31/2017 14:18   Ct Head Wo Contrast  Result Date: 07/31/2017 CLINICAL DATA:  Dizziness. Dementia. History of previous subdural hematomas EXAM: CT HEAD WITHOUT CONTRAST TECHNIQUE: Contiguous axial images were obtained from the base of the skull through the vertex without intravenous contrast. COMPARISON:  July 09, 2017 and July 07, 2017 FINDINGS: Brain: The previously noted subacute appearing left parietal subdural hematoma currently measures 8.5 mm in maximum thickness, similar compared to most recent studies. Subacute appearing left frontal subdural hematoma currently measures 5 mm in maximum thickness, stable. Previously noted subacute right frontal subdural hematoma measures 4 mm in thickness, essentially stable. There are no new extra-axial fluid collections. There is no acute appearing intra-axial or extra-axial hemorrhage. No midline shift. The degree of mass effect on the right parietal lobe is mild and stable. No new mass-effect seen on either side. Ventricles are moderately enlarged. Shunt catheter tip is in the frontal horn left lateral ventricular region, stable. There is no intra-axial mass or edema. There is encephalomalacia along the shunt catheter tract in the anterior right parietal lobe, stable. There is small vessel disease in the centra semiovale bilaterally, stable. No new gray-white compartment lesion is evident. There is no evident acute infarct. Vascular: There is no hyperdense vessel. There is calcification in each carotid siphon. Skull: There is a defect from shunt catheter placement in the posterior right temporal bone. Bony structures otherwise appear intact. Sinuses/Orbits: There is mucosal thickening in several ethmoid air cells. Other visualized paranasal sinuses are clear. Orbits appear symmetric bilaterally. Other: Mastoid air cells are clear. There is debris in each external auditory canal. IMPRESSION: 1. Subacute hematomas bilaterally  are essentially stable. Hematoma is somewhat larger on the left than on the right in the parietal region with maximum thickness slightly greater than 8 mm. There is localized mass effect in this area, similar to recent prior studies. No midline shift. 2. No intra-axial or extra-axial acute appearing hemorrhage. There is ventricular prominence with shunt catheter present, stable. There is patchy small vessel disease in the centra semiovale bilaterally. No acute appearing infarct evident. 3.  There are multiple foci of arterial vascular calcification. 4.  Areas of ethmoid sinus disease bilaterally. 5.  Probable cerumen in each external auditory canal. Electronically Signed   By: Lowella Grip III M.D.   On: 07/31/2017 14:07    Pending Labs Unresulted Labs    Start     Ordered   07/31/17 1420  Culture, blood (routine x 2)  BLOOD CULTURE X 2,   STAT     07/31/17 1419   07/31/17 1420  Urine culture  STAT,   STAT     07/31/17 1419      Vitals/Pain Today's Vitals   07/31/17 1312 07/31/17 1313 07/31/17 1457 07/31/17 1530  BP:    (!) 145/94  Pulse:    (!) 107  Resp:    (!) 26  Temp:   98.3 F (36.8 C)   TempSrc:   Rectal   SpO2:    96%  PainSc: 0-No pain 0-No pain      Isolation Precautions No active isolations  Medications Medications  cefTRIAXone (ROCEPHIN) 1 g in dextrose 5 % 50 mL IVPB (0 g Intravenous Stopped 07/31/17 1608)  sodium chloride 0.9 % bolus  1,000 mL (1,000 mLs Intravenous New Bag/Given 07/31/17 1528)    Mobility non-ambulatory

## 2017-07-31 NOTE — ED Notes (Signed)
Unable to obtain second set of blood cultures prior to administering antibiotics.

## 2017-07-31 NOTE — Progress Notes (Addendum)
Pt with BIL overgrown toe nails on right and left feet embeded in 2nd toes with redness, tendernes and drainage, red streck down both feet radiating to ankle in lateral formation with warmth noted along redden areas, pulses palpable, extremely tender to touch for patient. Cleaned area with NS and applied Alyven into further recommendations. Wife states new findings from last admission to current admission approximately 3 weeks past. Consulted WOC team to check and make recommendation for patient. SRP, RN

## 2017-07-31 NOTE — ED Provider Notes (Signed)
[place] DEPT Provider Note   CSN: [id number] Arrival date & time: 07/31/17  1256     History   Chief Complaint Chief Complaint  Patient presents with  . Dizziness    HPI Mitchell [name] is a 83 y.o. male.  The history is provided by the patient, a relative and medical records. No language interpreter was used.    Mitchell [name] is a 83 y.o. male who presents to the Emergency Department complaining of dizziness.  Level V caveat due to dementia.  He is a resident at [facility] for the last 3 weeks. He was recently discharged from the hospital following admission for subdural hematoma and NPH. He was doing well following hospital discharge until this morning. He was in physical therapy when he began to complain of shortness of breath, fatigue, dizziness.  He was noted to have BP 80/48.  In the emergency department he endorses feeling dizzy described as room spinning sensation as well as mild shortness of breath. No reports of fevers, vomiting, diarrhea, abdominal pain, chest pain leg swelling or pain.   Past Medical History:  Diagnosis Date  . Arthritis   . BPH (benign prostatic hyperplasia)   . Calcium nephrolithiasis   . Cataract   . Compression fracture of L2 lumbar vertebra (HCC)    and L4  . Inguinal hernia   . Osteoarthritis   . Spondylosis   . Subdural hematoma [facility])     Patient Active Problem List   Diagnosis Date Noted  . UTI (urinary tract infection) 07/31/2017  . SDH (subdural hematoma) ([place]) 07/08/2017  . Pressure injury of skin 07/08/2017  . Subdural hematoma ([place]) 07/08/2017    History reviewed. No pertinent surgical history.     Home Medications    Prior to Admission medications   Medication Sig Start Date End Date Taking? Authorizing Provider  acetaminophen (TYLENOL) 500 MG tablet Take 500 mg by mouth every 6 (six) hours as needed for mild pain or moderate pain.   Yes [provider]  ALPRAZolam (XANAX) 0.25 MG tablet Take 0.5 tablets  (0.125 mg total) by mouth 3 (three) times daily. 07/10/17  Yes Patrecia Pour, MD  aspirin 81 MG chewable tablet Chew 81 mg by mouth daily.   Yes [provider]  buPROPion (WELLBUTRIN XL) 150 MG 24 hr tablet Take 150 mg by mouth every morning. 06/29/17  Yes [provider]  calcium carbonate (OS-CAL - DOSED IN MG OF ELEMENTAL CALCIUM) 1250 (500 Ca) MG tablet Take 1 tablet by mouth.   Yes [provider]  Multiple Vitamin (MULTIVITAMIN WITH MINERALS) TABS tablet Take 1 tablet by mouth daily.   Yes [provider]  MYRBETRIQ 50 MG TB24 tablet Take 50 mg by mouth daily. 06/07/17  Yes [provider]  Omega-3 Fatty Acids (FISH OIL) 1000 MG CAPS Take 1,000 mg by mouth daily.   Yes [provider]  tamsulosin (FLOMAX) 0.4 MG CAPS capsule Take 0.4 mg by mouth daily. 06/07/17  Yes [provider]    Family History History reviewed. No pertinent family history.  Social History Social History  Substance Use Topics  . Smoking status: Current Every Day Smoker  . Smokeless tobacco: Never Used  . Alcohol use No     Allergies   Moxifloxacin and Quinine derivatives   Review of Systems Review of Systems  All other systems reviewed and are negative.    Physical Exam Updated Vital Signs BP (!) 145/94   Pulse (!) 107  Temp 98.3 F (36.8 C) (Rectal)   Resp (!) 26   SpO2 96%   Physical Exam  Constitutional: He appears well-developed and well-nourished.  HENT:  Head: Normocephalic and atraumatic.  Cardiovascular: Regular rhythm.   No murmur heard. tachycardic  Pulmonary/Chest: Effort normal and breath sounds normal. No respiratory distress.  Abdominal: Soft. There is no tenderness. There is no rebound and no guarding.  Musculoskeletal: He exhibits no edema or tenderness.  Neurological: He is alert.  Hard of hearing. Disoriented to time. 5 out of 5 strength in all 4 extremities  Skin: Skin is warm and dry.  Psychiatric: He has a  normal mood and affect. His behavior is normal.  Nursing note and vitals reviewed.    ED Treatments / Results  Labs (all labs ordered are listed, but only abnormal results are displayed) Labs Reviewed  COMPREHENSIVE METABOLIC PANEL - Abnormal; Notable for the following:       Result Value   Glucose, Bld 135 (*)    BUN 26 (*)    Creatinine, Ser 1.45 (*)    Calcium 8.5 (*)    Albumin 3.0 (*)    GFR calc non Af Amer 41 (*)    GFR calc Af Amer 48 (*)    All other components within normal limits  CBC WITH DIFFERENTIAL/PLATELET - Abnormal; Notable for the following:    WBC 12.3 (*)    Hemoglobin 12.8 (*)    Neutro Abs 9.8 (*)    Monocytes Absolute 1.3 (*)    All other components within normal limits  URINALYSIS, ROUTINE W REFLEX MICROSCOPIC - Abnormal; Notable for the following:    APPearance CLOUDY (*)    Hgb urine dipstick MODERATE (*)    Protein, ur 30 (*)    Leukocytes, UA LARGE (*)    Bacteria, UA MANY (*)    Squamous Epithelial / LPF 0-5 (*)    All other components within normal limits  CULTURE, BLOOD (ROUTINE X 2)  CULTURE, BLOOD (ROUTINE X 2)  URINE CULTURE  I-STAT TROPONIN, ED  I-STAT CG4 LACTIC ACID, ED    EKG  EKG Interpretation  Date/Time:  Tuesday July 31 2017 13:15:26 EDT Ventricular Rate:  121 PR Interval:    QRS Duration: 87 QT Interval:  339 QTC Calculation: 475 R Axis:   59 Text Interpretation:  Sinus tachycardia Atrial premature complexes Borderline low voltage, extremity leads Baseline wander Artifact Confirmed by Quintella Reichert [phone]) on 07/31/2017 1:31:07 PM Also confirmed by Quintella Reichert [phone]), editor Philomena Doheny [phone])  on 07/31/2017 2:17:41 PM       Radiology Dg Chest 2 View  Result Date: 07/31/2017 CLINICAL DATA:  Weak dizzy and shortness of breath EXAM: CHEST  2 VIEW COMPARISON:  Report 01/14/2001 FINDINGS: Elevation of the left diaphragm with basilar atelectasis. No pleural effusion or focal consolidation is seen. Right-sided  shunt catheter. Cardiomediastinal silhouette within normal limits. Aortic atherosclerosis. No pneumothorax. IMPRESSION: 1. Elevated left diaphragm with basilar atelectasis 2. No focal consolidation Electronically Signed   By: Donavan Foil M.D.   On: 07/31/2017 14:18   Ct Head Wo Contrast  Result Date: 07/31/2017 CLINICAL DATA:  Dizziness. Dementia. History of previous subdural hematomas EXAM: CT HEAD WITHOUT CONTRAST TECHNIQUE: Contiguous axial images were obtained from the base of the skull through the vertex without intravenous contrast. COMPARISON:  July 09, 2017 and July 07, 2017 FINDINGS: Brain: The previously noted subacute appearing left parietal subdural hematoma currently measures 8.5 mm in maximum thickness, similar compared to  most recent studies. Subacute appearing left frontal subdural hematoma currently measures 5 mm in maximum thickness, stable. Previously noted subacute right frontal subdural hematoma measures 4 mm in thickness, essentially stable. There are no new extra-axial fluid collections. There is no acute appearing intra-axial or extra-axial hemorrhage. No midline shift. The degree of mass effect on the right parietal lobe is mild and stable. No new mass-effect seen on either side. Ventricles are moderately enlarged. Shunt catheter tip is in the frontal horn left lateral ventricular region, stable. There is no intra-axial mass or edema. There is encephalomalacia along the shunt catheter tract in the anterior right parietal lobe, stable. There is small vessel disease in the centra semiovale bilaterally, stable. No new gray-white compartment lesion is evident. There is no evident acute infarct. Vascular: There is no hyperdense vessel. There is calcification in each carotid siphon. Skull: There is a defect from shunt catheter placement in the posterior right temporal bone. Bony structures otherwise appear intact. Sinuses/Orbits: There is mucosal thickening in several ethmoid air cells.  Other visualized paranasal sinuses are clear. Orbits appear symmetric bilaterally. Other: Mastoid air cells are clear. There is debris in each external auditory canal. IMPRESSION: 1. Subacute hematomas bilaterally are essentially stable. Hematoma is somewhat larger on the left than on the right in the parietal region with maximum thickness slightly greater than 8 mm. There is localized mass effect in this area, similar to recent prior studies. No midline shift. 2. No intra-axial or extra-axial acute appearing hemorrhage. There is ventricular prominence with shunt catheter present, stable. There is patchy small vessel disease in the centra semiovale bilaterally. No acute appearing infarct evident. 3.  There are multiple foci of arterial vascular calcification. 4.  Areas of ethmoid sinus disease bilaterally. 5.  Probable cerumen in each external auditory canal. Electronically Signed   By: Lowella Grip III M.D.   On: 07/31/2017 14:07    Procedures Procedures (including critical care time)  Medications Ordered in ED Medications  ALPRAZolam (XANAX) tablet 0.125 mg (not administered)  aspirin chewable tablet 81 mg (not administered)  mirabegron ER (MYRBETRIQ) tablet 50 mg (not administered)  sodium chloride flush (NS) 0.9 % injection 3 mL (not administered)  0.9 %  sodium chloride infusion (not administered)  acetaminophen (TYLENOL) tablet 650 mg (not administered)    Or  acetaminophen (TYLENOL) suppository 650 mg (not administered)  polyethylene glycol (MIRALAX / GLYCOLAX) packet 17 g (not administered)  ondansetron (ZOFRAN) tablet 4 mg (not administered)    Or  ondansetron (ZOFRAN) injection 4 mg (not administered)  cefTRIAXone (ROCEPHIN) 1 g in dextrose 5 % 50 mL IVPB (0 g Intravenous Stopped 07/31/17 1608)  sodium chloride 0.9 % bolus 1,000 mL (1,000 mLs Intravenous New Bag/Given 07/31/17 1528)     Initial Impression / Assessment and Plan / ED Course  I have reviewed the triage vital signs  and the nursing notes.  Pertinent labs & imaging results that were available during my care of the patient were reviewed by me and considered in my medical decision making (see chart for details).     Patient with history of subdural hematoma, dementia here for evaluation of tachycardia, weakness, shortness of breath. He has no complaints in the emergency department but is confused at his baseline. He is noted to be tachycardic. UA is concerning for UTI, will send cultures and treat. Given persistent tachycardia in the emergency Department plan to admit for observation for UTI given his vital signs. Hospitalist consulted for admission.  Final Clinical  Impressions(s) / ED Diagnoses   Final diagnoses:  None    New Prescriptions New Prescriptions   No medications on file     Quintella Reichert, MD 07/31/17 1644

## 2017-07-31 NOTE — Progress Notes (Signed)
Pharmacy Antibiotic Note  Mitchell [name] is a 83 y.o. male admitted on 07/31/2017 with UTI.  Pharmacy has been consulted for Ceftriaxone dosing.  Plan: Ceftriaxone 1g IV q24h.  Ceftriaxone does not require renal/hepatic adjustment, so pharmacy will sign off at this time. Please re-consult if require further assistance.   Height: 5\' 8"  (172.7 cm) Weight: 156 lb 12 oz (71.1 kg) IBW/kg (Calculated) : 68.4  Temp (24hrs), Avg:98.2 F (36.8 C), Min:97.6 F (36.4 C), Max:98.7 F (37.1 C)   Recent Labs Lab 07/31/17 1330 07/31/17 1441  WBC 12.3*  --   CREATININE 1.45*  --   LATICACIDVEN  --  1.18    Estimated Creatinine Clearance: 33.4 mL/min (A) (by C-G formula based on SCr of 1.45 mg/dL (H)).    Allergies  Allergen Reactions  . Moxifloxacin   . Quinine Derivatives Other (See Comments)    Could not walk    Antimicrobials this admission: 9/4 >> Ceftriaxone >>  Dose adjustments this admission: --  Microbiology results: 9/4 BCx: sent 9/4 UCx: sent  9/4 MRSA PCR: sent  Thank you for allowing pharmacy to be a part of this patient's care.   Lindell Spar, PharmD, BCPS Pager: [phone] 07/31/2017 5:08 PM

## 2017-07-31 NOTE — ED Notes (Signed)
Pt unable to stand. Unable to get standing vitals. Rn aware.

## 2017-07-31 NOTE — ED Notes (Signed)
ED Provider at bedside. 

## 2017-07-31 NOTE — ED Notes (Signed)
Patient transported to CT 

## 2017-08-01 DIAGNOSIS — G912 (Idiopathic) normal pressure hydrocephalus: Secondary | ICD-10-CM | POA: Diagnosis present

## 2017-08-01 DIAGNOSIS — Z79899 Other long term (current) drug therapy: Secondary | ICD-10-CM | POA: Diagnosis not present

## 2017-08-01 DIAGNOSIS — A419 Sepsis, unspecified organism: Secondary | ICD-10-CM | POA: Diagnosis present

## 2017-08-01 DIAGNOSIS — N3000 Acute cystitis without hematuria: Secondary | ICD-10-CM

## 2017-08-01 DIAGNOSIS — R4182 Altered mental status, unspecified: Secondary | ICD-10-CM | POA: Diagnosis not present

## 2017-08-01 DIAGNOSIS — R000 Tachycardia, unspecified: Secondary | ICD-10-CM | POA: Diagnosis not present

## 2017-08-01 DIAGNOSIS — Z888 Allergy status to other drugs, medicaments and biological substances status: Secondary | ICD-10-CM | POA: Diagnosis not present

## 2017-08-01 DIAGNOSIS — A4151 Sepsis due to Escherichia coli [E. coli]: Secondary | ICD-10-CM | POA: Diagnosis not present

## 2017-08-01 DIAGNOSIS — Z7982 Long term (current) use of aspirin: Secondary | ICD-10-CM | POA: Diagnosis not present

## 2017-08-01 DIAGNOSIS — E860 Dehydration: Secondary | ICD-10-CM | POA: Diagnosis present

## 2017-08-01 DIAGNOSIS — N179 Acute kidney failure, unspecified: Secondary | ICD-10-CM | POA: Diagnosis present

## 2017-08-01 DIAGNOSIS — B9620 Unspecified Escherichia coli [E. coli] as the cause of diseases classified elsewhere: Secondary | ICD-10-CM | POA: Diagnosis present

## 2017-08-01 DIAGNOSIS — S065X0A Traumatic subdural hemorrhage without loss of consciousness, initial encounter: Secondary | ICD-10-CM | POA: Diagnosis not present

## 2017-08-01 DIAGNOSIS — Z982 Presence of cerebrospinal fluid drainage device: Secondary | ICD-10-CM | POA: Diagnosis not present

## 2017-08-01 DIAGNOSIS — N400 Enlarged prostate without lower urinary tract symptoms: Secondary | ICD-10-CM | POA: Diagnosis present

## 2017-08-01 DIAGNOSIS — M1990 Unspecified osteoarthritis, unspecified site: Secondary | ICD-10-CM | POA: Diagnosis present

## 2017-08-01 DIAGNOSIS — Z881 Allergy status to other antibiotic agents status: Secondary | ICD-10-CM | POA: Diagnosis not present

## 2017-08-01 DIAGNOSIS — F17210 Nicotine dependence, cigarettes, uncomplicated: Secondary | ICD-10-CM | POA: Diagnosis present

## 2017-08-01 DIAGNOSIS — N390 Urinary tract infection, site not specified: Secondary | ICD-10-CM | POA: Diagnosis present

## 2017-08-01 DIAGNOSIS — I959 Hypotension, unspecified: Secondary | ICD-10-CM | POA: Diagnosis present

## 2017-08-01 DIAGNOSIS — R296 Repeated falls: Secondary | ICD-10-CM | POA: Diagnosis present

## 2017-08-01 DIAGNOSIS — F0391 Unspecified dementia with behavioral disturbance: Secondary | ICD-10-CM | POA: Diagnosis present

## 2017-08-01 DIAGNOSIS — I471 Supraventricular tachycardia: Secondary | ICD-10-CM | POA: Diagnosis not present

## 2017-08-01 DIAGNOSIS — R32 Unspecified urinary incontinence: Secondary | ICD-10-CM | POA: Diagnosis present

## 2017-08-01 LAB — CBC
HCT: 38.8 % — ABNORMAL LOW (ref 39.0–52.0)
HEMOGLOBIN: 12.5 g/dL — AB (ref 13.0–17.0)
MCH: 28.9 pg (ref 26.0–34.0)
MCHC: 32.2 g/dL (ref 30.0–36.0)
MCV: 89.6 fL (ref 78.0–100.0)
Platelets: 257 10*3/uL (ref 150–400)
RBC: 4.33 MIL/uL (ref 4.22–5.81)
RDW: 13.9 % (ref 11.5–15.5)
WBC: 13.7 10*3/uL — ABNORMAL HIGH (ref 4.0–10.5)

## 2017-08-01 LAB — COMPREHENSIVE METABOLIC PANEL
ALBUMIN: 2.8 g/dL — AB (ref 3.5–5.0)
ALK PHOS: 55 U/L (ref 38–126)
ALT: 18 U/L (ref 17–63)
ANION GAP: 6 (ref 5–15)
AST: 19 U/L (ref 15–41)
BILIRUBIN TOTAL: 0.6 mg/dL (ref 0.3–1.2)
BUN: 20 mg/dL (ref 6–20)
CALCIUM: 8.4 mg/dL — AB (ref 8.9–10.3)
CO2: 24 mmol/L (ref 22–32)
Chloride: 108 mmol/L (ref 101–111)
Creatinine, Ser: 1.31 mg/dL — ABNORMAL HIGH (ref 0.61–1.24)
GFR calc Af Amer: 54 mL/min — ABNORMAL LOW (ref >60)
GFR calc non Af Amer: 47 mL/min — ABNORMAL LOW (ref >60)
GLUCOSE: 87 mg/dL (ref 65–99)
Potassium: 3.8 mmol/L (ref 3.5–5.1)
Sodium: 138 mmol/L (ref 135–145)
TOTAL PROTEIN: 6.2 g/dL — AB (ref 6.5–8.1)

## 2017-08-01 MED ORDER — SODIUM CHLORIDE 0.9 % IV SOLN
INTRAVENOUS | Status: DC
  Administered 2017-08-01 – 2017-08-02 (×3): via INTRAVENOUS

## 2017-08-01 MED ORDER — TAMSULOSIN HCL 0.4 MG PO CAPS
0.4000 mg | ORAL_CAPSULE | Freq: Every day | ORAL | Status: DC
  Administered 2017-08-01 – 2017-08-02 (×2): 0.4 mg via ORAL
  Filled 2017-08-01 (×2): qty 1

## 2017-08-01 MED ORDER — LABETALOL HCL 5 MG/ML IV SOLN
10.0000 mg | INTRAVENOUS | Status: DC | PRN
  Administered 2017-08-01: 10 mg via INTRAVENOUS
  Filled 2017-08-01 (×2): qty 4

## 2017-08-01 MED ORDER — ALPRAZOLAM 0.25 MG PO TABS
0.1250 mg | ORAL_TABLET | Freq: Two times a day (BID) | ORAL | Status: DC
  Administered 2017-08-01 – 2017-08-02 (×3): 0.125 mg via ORAL
  Filled 2017-08-01 (×3): qty 1

## 2017-08-01 NOTE — Clinical Social Work Note (Signed)
Clinical Social Work Assessment  Patient Details  Name: Mitchell [name] MRN: [id number] Date of Birth: 05/26/1925  Date of referral:  08/01/17               Reason for consult:  Facility Placement                Permission sought to share information with:  Chartered certified accountant granted to share information::  Yes, Verbal Permission Granted  Name::        Agency::     Relationship::     Contact Information:     Housing/Transportation Living arrangements for the past 2 months:  Single Family Home, [place] (Patient has been at [facility] for [place] rehab for almost 3 weeks) Source of Information:  Spouse Patient Interpreter Needed:  None Criminal Activity/Legal Involvement Pertinent to Current Situation/Hospitalization:  No - Comment as needed Significant Relationships:  Spouse Lives with:  Spouse Do you feel safe going back to the place where you live?  Yes Need for family participation in patient care:  Yes (Comment)  Care giving concerns:  Patient from [facility] where he is receiving ST rehab. Patient's wife reported that patient encountered a bad fall approx. 3 weeks ago and that he has trouble walking since. Patient's wife reported that prior to his fall patient was walking with a cane and that he was walking up and down the stairs in their home. Patient's wife reported that since the fall patient has not progressed with his rehab like she thought that he should. Patient's wife reported that the plan is for patient to return to [facility] SNF for [place] rehab.    Social Worker assessment / plan:  CSW spoke with patient's wife Mitchell [name] on the phone since she was not at the hospital, patient oriented x person and has a hx of dementia unable to participate in assessment alone. CSW spoke with patient's wife regarding discharge planning. Patient's wife reported that the plan is for patient to return to [facility] and continue ST rehab.   CSW contacted  [facility] and confirmed patient's ability to return. CSW will complete FL2 and continue to follow to assist with discharge planning.   Employment status:  Retired Forensic scientist:  Medicare PT Recommendations:  Not assessed at this time Information / Referral to community resources:  [place]  Patient/Family's Response to care:  Patient's wife thanked CSW for assistance with discharge planning.   Patient/Family's Understanding of and Emotional Response to Diagnosis, Current Treatment, and Prognosis:  Patient's wife verbalized strong understanding of patient's diagnosis and current treatment. Patient's wife reported that she has been encouraging patient to participate in [place] rehab but she feels that patient is fearful of falling again and hesitant to participate. CSW positively affirmed patient's wife participation/assistance with patient's care. Patient's wife reported that the goal is for patient to finish ST rehab and return home.   Emotional Assessment Appearance:    Attitude/Demeanor/Rapport:  Unable to Assess Affect (typically observed):  Unable to Assess Orientation:  Oriented to Self Alcohol / Substance use:  Not Applicable Psych involvement (Current and /or in the community):  No (Comment)  Discharge Needs  Concerns to be addressed:  No discharge needs identified Readmission within the last 30 days:  Yes Current discharge risk:  None Barriers to Discharge:  Continued Medical Work up   [employer], LCSW 08/01/2017, 11:33 AM

## 2017-08-01 NOTE — Evaluation (Signed)
Physical Therapy Evaluation Patient Details Name: Mitchell [name] MRN: [id number] DOB: 1925/11/13 Today's Date: 08/01/2017   History of Present Illness  83 yo male admitted with recurrent falls, polypharmacy, NPH, dementia, SDH admitted with dizziness, hypotension and tachycardia, possible UTI.  Clinical Impression  Pt admitted with above diagnosis. Pt currently with functional limitations due to the deficits listed below (see PT Problem List).  Pt will benefit from skilled PT to increase their independence and safety with mobility to allow discharge to the venue listed below.  Pt very fearful of falling but eventually agreed to attempt standing.  Pt reports dizziness in sitting and standing (reported to RN, unable to obtain BP at time due to assist).  Pt and spouse plan for pt to return to [facility] for continuing therapy.       Follow Up Recommendations SNF    Equipment Recommendations  None recommended by PT    Recommendations for Other Services       Precautions / Restrictions Precautions Precautions: Fall Precaution Comments: high fall risk, very fearful of falling Restrictions Weight Bearing Restrictions: No      Mobility  Bed Mobility Overal bed mobility: Needs Assistance Bed Mobility: Supine to Sit     Supine to sit: HOB elevated;Max assist;+2 for physical assistance     General bed mobility comments: Assist for trunk and to scoot to EOB. Increased time. Cues for technique. Utilized bedpad for scooting.   Transfers Overall transfer level: Needs assistance Equipment used: Rolling walker (2 wheeled) Transfers: Sit to/from Stand Sit to Stand: +2 physical assistance;+2 safety/equipment;From elevated surface;Max assist         General transfer comment: Assist to rise, stabilize, control descent.  once pt in standing, he was able to maintain min assist for steadying, provided pericare has pt has some BM on bedpad; pt c/o dizziness upon sitting as well as standing  which did not resolve  Ambulation/Gait             General Gait Details: pt did not wish to attempt today, returned to sitting  Stairs            Wheelchair Mobility    Modified Rankin (Stroke Patients Only)       Balance Overall balance assessment: History of Falls;Needs assistance         Standing balance support: Bilateral upper extremity supported Standing balance-Leahy Scale: Poor Standing balance comment: requires UE support and slight external assist                             Pertinent Vitals/Pain Pain Assessment: No/denies pain    Home Living Family/patient expects to be discharged to:: Skilled nursing facility (plans to return to [facility] for rehab) Living Arrangements: Spouse/significant other             Home Equipment: Walker - 2 wheels;Cane - single point      Prior Function Level of Independence: Independent with assistive device(s)         Comments: spouse reports pt was ambulating and performing steps with SPC prior to previous admision 3 weeks ago, she also reports pt very fearful of falling now possibly effecting his performance     Hand Dominance        Extremity/Trunk Assessment        Lower Extremity Assessment Lower Extremity Assessment: Generalized weakness    Cervical / Trunk Assessment Cervical / Trunk Assessment: Kyphotic  Communication   Communication:  HOH  Cognition Arousal/Alertness: Awake/alert Behavior During Therapy: Anxious Overall Cognitive Status: History of cognitive impairments - at baseline                                 General Comments: pt tends to state "let's do this later"  per spouse he says this often for therapy now      General Comments      Exercises     Assessment/Plan    PT Assessment Patient needs continued PT services  PT Problem List Decreased strength;Decreased mobility;Decreased activity tolerance;Decreased balance;Decreased knowledge of  use of DME;Decreased cognition       PT Treatment Interventions DME instruction;Therapeutic activities;Therapeutic exercise;Gait training;Patient/family education;Balance training;Functional mobility training;Wheelchair mobility training    PT Goals (Current goals can be found in the Care Plan section)  Acute Rehab PT Goals PT Goal Formulation: With patient/family Time For Goal Achievement: 08/15/17 Potential to Achieve Goals: Good    Frequency Min 2X/week   Barriers to discharge        Co-evaluation               AM-PAC PT "6 Clicks" Daily Activity  Outcome Measure Difficulty turning over in bed (including adjusting bedclothes, sheets and blankets)?: Unable Difficulty moving from lying on back to sitting on the side of the bed? : Unable Difficulty sitting down on and standing up from a chair with arms (e.g., wheelchair, bedside commode, etc,.)?: Unable Help needed moving to and from a bed to chair (including a wheelchair)?: Total Help needed walking in hospital room?: Total Help needed climbing 3-5 steps with a railing? : Total 6 Click Score: 6    End of Session Equipment Utilized During Treatment: Gait belt Activity Tolerance: Patient limited by fatigue;Other (comment) (limited by fear of falling) Patient left: with call bell/phone within reach;with family/visitor present;in bed;with bed alarm set Nurse Communication: Mobility status PT Visit Diagnosis: Difficulty in walking, not elsewhere classified (R26.2);Muscle weakness (generalized) (M62.81)    Time: 4680-3212 PT Time Calculation (min) (ACUTE ONLY): 16 min   Charges:   PT Evaluation $PT Eval Low Complexity: 1 Low     PT G Codes:   PT G-Codes **NOT FOR INPATIENT CLASS** Functional Assessment Tool Used: AM-PAC 6 Clicks Basic Mobility;Clinical judgement Functional Limitation: Mobility: Walking and moving around Mobility: Walking and Moving Around Current Status (Y4825): 100 percent impaired, limited or  restricted Mobility: Walking and Moving Around Goal Status (O0370): At least 60 percent but less than 80 percent impaired, limited or restricted   Carmelia Bake, PT, DPT 08/01/2017 Pager: [phone]   [place] Ram E 08/01/2017, 3:39 PM

## 2017-08-01 NOTE — Progress Notes (Addendum)
PROGRESS NOTE    Mitchell [name]  JAS:[id number] DOB: [birth year] DOA: 07/31/2017 PCP: [name], MD    Brief Narrative: 83 yo male admitted with recurrent falls ,polypharmacy.NPH,dementia,SDH admitted with dizziness,hypotension and tachycardia.admitted for AKI.Receiving rocephin for possible uti.   Assessment & Plan:   Active Problems:   UTI (urinary tract infection)  Possible UTI on rocephin.dc rocephin if culture comes back normal.bladder scan with very minimal urine 90cc.  Dizziness multifactorial.decrease xanax dose.  BPH resume flomax.  NPH/VP Shunt stable.  Mild AKI continue slow iv hydration.  Overgrown hypertrophic nails..need outpatient follow up with podiatrist.      DVT prophylaxisscd Code Status: full Family Communication: none Disposition Plan: dc if urine culture normal.   Consultants:   Procedures:   Antimicrobials: rocephin   Subjective: Feels better.  Objective: Vitals:   07/31/17 1530 07/31/17 1722 07/31/17 2130 08/01/17 0355  BP: (!) 145/94 (!) 153/99 139/83 140/77  Pulse: (!) 107 (!) 110 (!) 113 (!) 109  Resp: (!) 26 18 17 16   Temp:  97.6 F (36.4 C) 98.4 F (36.9 C) 98.2 F (36.8 C)  TempSrc:  Oral Oral Oral  SpO2: 96% 100% 98% 92%  Weight:  71.1 kg (156 lb 12 oz)    Height:  5\' 8"  (1.727 m)      Intake/Output Summary (Last 24 hours) at 08/01/17 1327 Last data filed at 08/01/17 0952  Gross per 24 hour  Intake          1846.67 ml  Output              530 ml  Net          1316.67 ml   Filed Weights   07/31/17 1722  Weight: 71.1 kg (156 lb 12 oz)    Examination:  General exam: Appears calm and comfortable  Respiratory system: Clear to auscultation. Respiratory effort normal. Cardiovascular system: S1 & S2 heard, RRR. No JVD, murmurs, rubs, gallops or clicks. No pedal edema. Gastrointestinal system: Abdomen is nondistended, soft and nontender. No organomegaly or masses felt. Normal bowel sounds heard. Central  nervous system: Alert and oriented. No focal neurological deficits. Extremities: Symmetric 5 x 5 power. Skin: No rashes, lesions or ulcers Psychiatry: Judgement and insight appear normal. Mood & affect appropriate.     Data Reviewed: I have personally reviewed following labs and imaging studies  CBC:  Recent Labs Lab 07/31/17 1330 08/01/17 0411  WBC 12.3* 13.7*  NEUTROABS 9.8*  --   HGB 12.8* 12.5*  HCT 39.1 38.8*  MCV 88.1 89.6  PLT 249 419   Basic Metabolic Panel:  Recent Labs Lab 07/31/17 1330 08/01/17 0411  NA 138 138  K 4.0 3.8  CL 106 108  CO2 25 24  GLUCOSE 135* 87  BUN 26* 20  CREATININE 1.45* 1.31*  CALCIUM 8.5* 8.4*   GFR: Estimated Creatinine Clearance: 37 mL/min (A) (by C-G formula based on SCr of 1.31 mg/dL (H)). Liver Function Tests:  Recent Labs Lab 07/31/17 1330 08/01/17 0411  AST 22 19  ALT 19 18  ALKPHOS 62 55  BILITOT 0.5 0.6  PROT 6.5 6.2*  ALBUMIN 3.0* 2.8*   No results for input(s): LIPASE, AMYLASE in the last 168 hours. No results for input(s): AMMONIA in the last 168 hours. Coagulation Profile: No results for input(s): INR, PROTIME in the last 168 hours. Cardiac Enzymes: No results for input(s): CKTOTAL, CKMB, CKMBINDEX, TROPONINI in the last 168 hours. BNP (last 3 results) No results for input(s):  PROBNP in the last 8760 hours. HbA1C: No results for input(s): HGBA1C in the last 72 hours. CBG: No results for input(s): GLUCAP in the last 168 hours. Lipid Profile: No results for input(s): CHOL, HDL, LDLCALC, TRIG, CHOLHDL, LDLDIRECT in the last 72 hours. Thyroid Function Tests: No results for input(s): TSH, T4TOTAL, FREET4, T3FREE, THYROIDAB in the last 72 hours. Anemia Panel: No results for input(s): VITAMINB12, FOLATE, FERRITIN, TIBC, IRON, RETICCTPCT in the last 72 hours. Sepsis Labs:  Recent Labs Lab 07/31/17 1441  LATICACIDVEN 1.18    Recent Results (from the past 240 hour(s))  Culture, blood (routine x 2)      Status: None (Preliminary result)   Collection Time: 07/31/17  2:34 PM  Result Value Ref Range Status   Specimen Description BLOOD LEFT FOREARM  Final   Special Requests   Final    BOTTLES DRAWN AEROBIC AND ANAEROBIC Blood Culture adequate volume   Culture   Final    NO GROWTH < 24 HOURS Performed at [facility], [place] [address] [place], [place] [address]    Report Status PENDING  Incomplete  Culture, blood (routine x 2)     Status: None (Preliminary result)   Collection Time: 07/31/17  5:31 PM  Result Value Ref Range Status   Specimen Description BLOOD RIGHT ARM  Final   Special Requests   Final    BOTTLES DRAWN AEROBIC ONLY Blood Culture adequate volume   Culture   Final    NO GROWTH < 24 HOURS Performed at [facility], [place] [address] [place], [place] [address]    Report Status PENDING  Incomplete  MRSA PCR Screening     Status: None   Collection Time: 07/31/17  6:25 PM  Result Value Ref Range Status   MRSA by PCR NEGATIVE NEGATIVE Final    Comment:        The GeneXpert MRSA Assay (FDA approved for NASAL specimens only), is one component of a comprehensive MRSA colonization surveillance program. It is not intended to diagnose MRSA infection nor to guide or monitor treatment for MRSA infections.          Radiology Studies: Dg Chest 2 View  Result Date: 07/31/2017 CLINICAL DATA:  Weak dizzy and shortness of breath EXAM: CHEST  2 VIEW COMPARISON:  Report 01/14/2001 FINDINGS: Elevation of the left diaphragm with basilar atelectasis. No pleural effusion or focal consolidation is seen. Right-sided shunt catheter. Cardiomediastinal silhouette within normal limits. Aortic atherosclerosis. No pneumothorax. IMPRESSION: 1. Elevated left diaphragm with basilar atelectasis 2. No focal consolidation Electronically Signed   By: Donavan Foil M.D.   On: 07/31/2017 14:18   Ct Head Wo Contrast  Result Date: 07/31/2017 CLINICAL DATA:  Dizziness. Dementia. History of  previous subdural hematomas EXAM: CT HEAD WITHOUT CONTRAST TECHNIQUE: Contiguous axial images were obtained from the base of the skull through the vertex without intravenous contrast. COMPARISON:  July 09, 2017 and July 07, 2017 FINDINGS: Brain: The previously noted subacute appearing left parietal subdural hematoma currently measures 8.5 mm in maximum thickness, similar compared to most recent studies. Subacute appearing left frontal subdural hematoma currently measures 5 mm in maximum thickness, stable. Previously noted subacute right frontal subdural hematoma measures 4 mm in thickness, essentially stable. There are no new extra-axial fluid collections. There is no acute appearing intra-axial or extra-axial hemorrhage. No midline shift. The degree of mass effect on the right parietal lobe is mild and stable. No new mass-effect seen on either side. Ventricles  are moderately enlarged. Shunt catheter tip is in the frontal horn left lateral ventricular region, stable. There is no intra-axial mass or edema. There is encephalomalacia along the shunt catheter tract in the anterior right parietal lobe, stable. There is small vessel disease in the centra semiovale bilaterally, stable. No new gray-white compartment lesion is evident. There is no evident acute infarct. Vascular: There is no hyperdense vessel. There is calcification in each carotid siphon. Skull: There is a defect from shunt catheter placement in the posterior right temporal bone. Bony structures otherwise appear intact. Sinuses/Orbits: There is mucosal thickening in several ethmoid air cells. Other visualized paranasal sinuses are clear. Orbits appear symmetric bilaterally. Other: Mastoid air cells are clear. There is debris in each external auditory canal. IMPRESSION: 1. Subacute hematomas bilaterally are essentially stable. Hematoma is somewhat larger on the left than on the right in the parietal region with maximum thickness slightly greater than 8  mm. There is localized mass effect in this area, similar to recent prior studies. No midline shift. 2. No intra-axial or extra-axial acute appearing hemorrhage. There is ventricular prominence with shunt catheter present, stable. There is patchy small vessel disease in the centra semiovale bilaterally. No acute appearing infarct evident. 3.  There are multiple foci of arterial vascular calcification. 4.  Areas of ethmoid sinus disease bilaterally. 5.  Probable cerumen in each external auditory canal. Electronically Signed   By: Lowella Grip III M.D.   On: 07/31/2017 14:07        Scheduled Meds: . ALPRAZolam  0.125 mg Oral BID  . aspirin  81 mg Oral Daily  . mirabegron ER  50 mg Oral Daily  . sodium chloride flush  3 mL Intravenous Q12H  . tamsulosin  0.4 mg Oral Daily   Continuous Infusions: . sodium chloride 100 mL/hr at 08/01/17 0350  . cefTRIAXone (ROCEPHIN)  IV       LOS: 0 days    Time spent:     Georgette Shell, MD [facility]  If 7PM-7AM, please contact night-coverage [email or web address] Password [email or web address] 08/01/2017, 1:27 PM

## 2017-08-02 DIAGNOSIS — I959 Hypotension, unspecified: Secondary | ICD-10-CM | POA: Diagnosis not present

## 2017-08-02 DIAGNOSIS — A4151 Sepsis due to Escherichia coli [E. coli]: Secondary | ICD-10-CM

## 2017-08-02 DIAGNOSIS — S065X0A Traumatic subdural hemorrhage without loss of consciousness, initial encounter: Secondary | ICD-10-CM | POA: Diagnosis not present

## 2017-08-02 DIAGNOSIS — I471 Supraventricular tachycardia: Secondary | ICD-10-CM | POA: Diagnosis not present

## 2017-08-02 DIAGNOSIS — R000 Tachycardia, unspecified: Secondary | ICD-10-CM | POA: Diagnosis not present

## 2017-08-02 LAB — URINE CULTURE: Culture: >=100000 — AB

## 2017-08-02 MED ORDER — ALPRAZOLAM 0.25 MG PO TABS: 0.2500 mg | ORAL_TABLET | Freq: Every evening | ORAL | 0 refills | Status: AC | PRN

## 2017-08-02 MED ORDER — CEPHALEXIN 500 MG PO CAPS
500.0000 mg | ORAL_CAPSULE | Freq: Two times a day (BID) | ORAL | Status: DC
  Administered 2017-08-02: 500 mg via ORAL
  Filled 2017-08-02: qty 1

## 2017-08-02 MED ORDER — CEPHALEXIN 500 MG PO CAPS: 500.0000 mg | ORAL_CAPSULE | Freq: Four times a day (QID) | ORAL | 0 refills | Status: AC

## 2017-08-02 NOTE — Progress Notes (Signed)
Patient returning to [facility]. PTAR contacted, family notified. Patient's RN can call report to [phone], packet complete. CSW signing off, no other needs identified at this time.  Abundio Miu, [place] Social Worker [facility] Cell#: [phone]

## 2017-08-02 NOTE — [place] FL2 (Signed)
[place] MEDICAID FL2 LEVEL OF CARE SCREENING TOOL     IDENTIFICATION  Patient Name: Mitchell [name] Birthdate: 1925-09-30 Sex: male Admission Date (Current Location): 07/31/2017  [place] [facility] and [place] Number:  Herbalist and Address:  [facility],  [place], [place]      Provider Number: [id number]  Attending Physician Name and Address:  Georgette Shell, MD  Relative Name and Phone Number:       Current Level of Care: Hospital Recommended Level of Care: [place] Prior Approval Number:    Date Approved/Denied:   PASRR Number: [id number]  Discharge Plan: SNF    Current Diagnoses: Patient Active Problem List   Diagnosis Date Noted  . Sepsis ([place]) 08/01/2017  . UTI (urinary tract infection) 07/31/2017  . SDH (subdural hematoma) ([place]) 07/08/2017  . Pressure injury of skin 07/08/2017  . Subdural hematoma ([facility]) 07/08/2017    Orientation RESPIRATION BLADDER Height & Weight     Self, Place, Time  Normal Incontinent Weight: 156 lb 12 oz (71.1 kg) Height:  5\' 8"  (172.7 cm)  BEHAVIORAL SYMPTOMS/MOOD NEUROLOGICAL BOWEL NUTRITION STATUS      Incontinent Diet (regular)  AMBULATORY STATUS COMMUNICATION OF NEEDS Skin   Extensive Assist Verbally Other (Comment) (PressureInjury08/12/18StageI-Intactskinwithnon-blanchablerednessofalocalizedareausuallyoverabonyprominence.)                       Personal Care Assistance Level of Assistance  Bathing, Feeding, Dressing Bathing Assistance: Limited assistance Feeding assistance: Independent Dressing Assistance: Limited assistance     Functional Limitations Info             SPECIAL CARE FACTORS FREQUENCY  PT (By licensed PT), OT (By licensed OT)     PT Frequency: 5x OT Frequency: 5x            Contractures Contractures Info: Not present    Additional Factors Info  Code Status, Allergies Code Status Info: Full Code Allergies Info:  Moxifloxacin,Quinine Derivatives           Current Medications (08/02/2017):  This is the current hospital active medication list Current Facility-Administered Medications  Medication Dose Route Frequency Provider Last Rate Last Dose  . 0.9 %  sodium chloride infusion   Intravenous Continuous Lavina Hamman, MD   Stopped at 08/01/17 1350  . 0.9 %  sodium chloride infusion   Intravenous Continuous Georgette Shell, MD 100 mL/hr at 08/02/17 0044    . acetaminophen (TYLENOL) tablet 650 mg  650 mg Oral Q6H PRN Lavina Hamman, MD       Or  . acetaminophen (TYLENOL) suppository 650 mg  650 mg Rectal Q6H PRN Lavina Hamman, MD      . ALPRAZolam Duanne Moron) tablet 0.125 mg  0.125 mg Oral BID Georgette Shell, MD   0.125 mg at 08/01/17 2140  . aspirin chewable tablet 81 mg  81 mg Oral Daily Lavina Hamman, MD   81 mg at 08/01/17 1205  . cefTRIAXone (ROCEPHIN) 1 g in dextrose 5 % 50 mL IVPB  1 g Intravenous Q24H Luiz Ochoa, [facility]   Stopped at 08/01/17 1623  . labetalol (NORMODYNE,TRANDATE) injection 10 mg  10 mg Intravenous Q2H PRN Opyd, Ilene Qua, MD   10 mg at 08/01/17 2217  . mirabegron ER (MYRBETRIQ) tablet 50 mg  50 mg Oral Daily Lavina Hamman, MD   50 mg at 08/01/17 1204  . ondansetron (ZOFRAN) tablet 4 mg  4 mg Oral Q6H  PRN Lavina Hamman, MD       Or  . ondansetron [facility]) injection 4 mg  4 mg Intravenous Q6H PRN Lavina Hamman, MD      . polyethylene glycol (MIRALAX / GLYCOLAX) packet 17 g  17 g Oral Daily PRN Lavina Hamman, MD      . sodium chloride flush (NS) 0.9 % injection 3 mL  3 mL Intravenous Q12H Lavina Hamman, MD   3 mL at 08/01/17 1205  . tamsulosin (FLOMAX) capsule 0.4 mg  0.4 mg Oral Daily Georgette Shell, MD   0.4 mg at 08/01/17 1205     Discharge Medications: Please see discharge summary for a list of discharge medications.  Relevant Imaging Results:  Relevant Lab Results:   Additional Information SS [id number]  Burnis Medin, LCSW

## 2017-08-02 NOTE — Discharge Summary (Signed)
Physician Discharge Summary  Mitchell [name]:[id number] DOB: [birth year] DOA: 07/31/2017  PCP: [name], MD  Admit date: 07/31/2017 Discharge date: 08/02/2017  Admitted From snf Disposition:    Recommendations for Outpatient Follow-up:  1. Follow up with PCP in 1-2 weeks 2. Please obtain BMP/CBC in one week 3. Please follow up on the following pending results:  Home Health Equipment/Devices  Discharge Conditionstable CODE STATUS:full Diet recommendation: regular  Brief/Interim Summary11 year old male admitted with recurrent falls found to have Escherichia coli urinary tract infection sensitive to cephalosporins treated with IV Rocephin changed to Keflex for 7 days. His condition remained stable during the hospital stay.   Discharge Diagnoses:  Active Problems:   UTI (urinary tract infection)   Sepsis ([place])  Sepsis with UTI continue and finish the course of Keflex for 7 days. Patient stable to be discharged back to nursing home today. We have cut  Down xanax  to twice a day.  Discharge Instructions   Allergies as of 08/02/2017      Reactions   Moxifloxacin    Quinine Derivatives Other (See Comments)   Could not walk      Medication List    STOP taking these medications   acetaminophen 500 MG tablet Commonly known as:  TYLENOL     TAKE these medications   ALPRAZolam 0.25 MG tablet Commonly known as:  XANAX Take 1 tablet (0.25 mg total) by mouth at bedtime as needed for anxiety. What changed:  how much to take  when to take this  reasons to take this   aspirin 81 MG chewable tablet Chew 81 mg by mouth daily.   buPROPion 150 MG 24 hr tablet Commonly known as:  WELLBUTRIN XL Take 150 mg by mouth every morning.   calcium carbonate 1250 (500 Ca) MG tablet Commonly known as:  OS-CAL - dosed in mg of elemental calcium Take 1 tablet by mouth.   cephALEXin 500 MG capsule Commonly known as:  KEFLEX Take 1 capsule (500 mg total) by mouth 4 (four) times  daily.   Fish Oil 1000 MG Caps Take 1,000 mg by mouth daily.   multivitamin with minerals Tabs tablet Take 1 tablet by mouth daily.   MYRBETRIQ 50 MG Tb24 tablet Generic drug:  mirabegron ER Take 50 mg by mouth daily.   tamsulosin 0.4 MG Caps capsule Commonly known as:  FLOMAX Take 0.4 mg by mouth daily.            Discharge Care Instructions        Start     Ordered   08/02/17 0000  ALPRAZolam (XANAX) 0.25 MG tablet  At bedtime PRN     08/02/17 1159   08/02/17 0000  cephALEXin (KEFLEX) 500 MG capsule  4 times daily     08/02/17 1159     Contact information for after-discharge care    Destination    [facility] .   Specialty:  [name] information: [address] [phone]             Allergies  Allergen Reactions  . Moxifloxacin   . Quinine Derivatives Other (See Comments)    Could not walk    Consultations:    Procedures/Studies: Dg Chest 2 View  Result Date: 07/31/2017 CLINICAL DATA:  Weak dizzy and shortness of breath EXAM: CHEST  2 VIEW COMPARISON:  Report 01/14/2001 FINDINGS: Elevation of the left diaphragm with basilar atelectasis. No pleural effusion or focal consolidation is  seen. Right-sided shunt catheter. Cardiomediastinal silhouette within normal limits. Aortic atherosclerosis. No pneumothorax. IMPRESSION: 1. Elevated left diaphragm with basilar atelectasis 2. No focal consolidation Electronically Signed   By: Donavan Foil M.D.   On: 07/31/2017 14:18   Ct Head Wo Contrast  Result Date: 07/31/2017 CLINICAL DATA:  Dizziness. Dementia. History of previous subdural hematomas EXAM: CT HEAD WITHOUT CONTRAST TECHNIQUE: Contiguous axial images were obtained from the base of the skull through the vertex without intravenous contrast. COMPARISON:  July 09, 2017 and July 07, 2017 FINDINGS: Brain: The previously noted subacute appearing left parietal subdural hematoma currently  measures 8.5 mm in maximum thickness, similar compared to most recent studies. Subacute appearing left frontal subdural hematoma currently measures 5 mm in maximum thickness, stable. Previously noted subacute right frontal subdural hematoma measures 4 mm in thickness, essentially stable. There are no new extra-axial fluid collections. There is no acute appearing intra-axial or extra-axial hemorrhage. No midline shift. The degree of mass effect on the right parietal lobe is mild and stable. No new mass-effect seen on either side. Ventricles are moderately enlarged. Shunt catheter tip is in the frontal horn left lateral ventricular region, stable. There is no intra-axial mass or edema. There is encephalomalacia along the shunt catheter tract in the anterior right parietal lobe, stable. There is small vessel disease in the centra semiovale bilaterally, stable. No new gray-white compartment lesion is evident. There is no evident acute infarct. Vascular: There is no hyperdense vessel. There is calcification in each carotid siphon. Skull: There is a defect from shunt catheter placement in the posterior right temporal bone. Bony structures otherwise appear intact. Sinuses/Orbits: There is mucosal thickening in several ethmoid air cells. Other visualized paranasal sinuses are clear. Orbits appear symmetric bilaterally. Other: Mastoid air cells are clear. There is debris in each external auditory canal. IMPRESSION: 1. Subacute hematomas bilaterally are essentially stable. Hematoma is somewhat larger on the left than on the right in the parietal region with maximum thickness slightly greater than 8 mm. There is localized mass effect in this area, similar to recent prior studies. No midline shift. 2. No intra-axial or extra-axial acute appearing hemorrhage. There is ventricular prominence with shunt catheter present, stable. There is patchy small vessel disease in the centra semiovale bilaterally. No acute appearing infarct  evident. 3.  There are multiple foci of arterial vascular calcification. 4.  Areas of ethmoid sinus disease bilaterally. 5.  Probable cerumen in each external auditory canal. Electronically Signed   By: Lowella Grip III M.D.   On: 07/31/2017 14:07   Ct Head Wo Contrast  Result Date: 07/09/2017 CLINICAL DATA:  Intracranial hemorrhage, follow-up EXAM: CT HEAD WITHOUT CONTRAST TECHNIQUE: Contiguous axial images were obtained from the base of the skull through the vertex without intravenous contrast. COMPARISON:  07/07/2017 FINDINGS: Brain: The right subdural hematoma has nearly completely resolved. Slight decrease in the size of the subacute left subdural hematoma, measuring 8 mm compared to 9-10 mm previously. No midline shift. Note new area of hemorrhage. Right parietal VP shunt again noted, unchanged. No hydrocephalus. Stable atrophy and chronic microvascular disease. Vascular: No hyperdense vessel or unexpected calcification. Skull: No acute calvarial abnormality. Sinuses/Orbits: Visualized paranasal sinuses and mastoids clear. Orbital soft tissues unremarkable. Other: None IMPRESSION: Near complete resolution of the subacute right subdural hematoma and slight decreased size of the left subdural hematoma as described above. Atrophy, chronic microvascular disease. Stable right parietal VP shunt and ventricular size. Electronically Signed   By: Rolm Baptise M.D.  On: 07/09/2017 12:51   Ct Head Wo Contrast  Result Date: 07/07/2017 CLINICAL DATA:  Status post fall, with laceration at the anterior aspect of the head. Initial encounter. EXAM: CT HEAD WITHOUT CONTRAST TECHNIQUE: Contiguous axial images were obtained from the base of the skull through the vertex without intravenous contrast. COMPARISON:  MRI of the brain performed 01/18/2005 FINDINGS: Brain: Subacute bilateral subdural hematomas are noted, measuring up to 5 mm on the right and 1.0 cm on the left. Hydrocephalus appears relatively stable, with  associated right-sided ventriculoperitoneal shunt ending at the left lateral ventricle. Moderate cortical volume loss is noted. Mild cerebellar atrophy is noted. Scattered periventricular white matter change likely reflects small vessel ischemic microangiopathy. The brainstem and fourth ventricle are within normal limits. The basal ganglia are unremarkable in appearance. The cerebral hemispheres demonstrate grossly normal gray-white differentiation. No mass effect or midline shift is seen. Vascular: No hyperdense vessel or unexpected calcification. Skull: There is no evidence of fracture; visualized osseous structures are unremarkable in appearance. Sinuses/Orbits: The visualized portions of the orbits are within normal limits. The paranasal sinuses and mastoid air cells are well-aerated. Other: No significant soft tissue abnormalities are seen. IMPRESSION: 1. Subacute bilateral subdural hematomas, measuring up to 5 mm on the right and 1.0 cm on the left. No significant midline shift seen. 2. Relatively stable appearance to hydrocephalus, with associated right-sided ventriculoperitoneal shunt ending at the left lateral ventricle. 3. Moderate cortical volume loss and scattered small vessel ischemic microangiopathy. These results were called by telephone at the time of interpretation on 07/07/2017 at 10:39 pm to [facility], who verbally acknowledged these results. Electronically Signed   By: Garald Balding M.D.   On: 07/07/2017 22:40   Dg Hand Complete Left  Result Date: 07/07/2017 CLINICAL DATA:  Status post fall, with left hand injury. Initial encounter. EXAM: LEFT HAND - COMPLETE 3+ VIEW COMPARISON:  None. FINDINGS: There is no evidence of fracture or dislocation. Chronic subluxation is noted at the second and third metacarpophalangeal joints. 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 Mild degenerative change is noted at the first carpometacarpal joint. Subcortical  cysts are noted at the proximal carpal row. The carpal rows are otherwise intact, and demonstrate normal alignment. The soft tissues are unremarkable in appearance. IMPRESSION: 1. No evidence of fracture or dislocation. 2. Erosive osteoarthritis noted. Electronically Signed   By: Garald Balding M.D.   On: 07/07/2017 22:09      Subjective:   Discharge Exam: Vitals:   08/02/17 0427 08/02/17 0925  BP: (!) 160/89 138/87  Pulse: (!) 102 95  Resp: 20 20  Temp: 98.1 F (36.7 C) 98.4 F (36.9 C)  SpO2: 97% 99%   Vitals:   08/01/17 2144 08/01/17 2321 08/02/17 0427 08/02/17 0925  BP: (!) 169/103 138/88 (!) 160/89 138/87  Pulse: (!) 124 96 (!) 102 95  Resp: 18  20 20   Temp: 98.4 F (36.9 C)  98.1 F (36.7 C) 98.4 F (36.9 C)  TempSrc: Oral  Oral Oral  SpO2: 96%  97% 99%  Weight:      Height:        General: Pt is alert, awake, not in acute distress Cardiovascular: RRR, S1/S2 +, no rubs, no gallops Respiratory: CTA bilaterally, no wheezing, no rhonchi Abdominal: Soft, NT, ND, bowel sounds + Extremities: no edema, no cyanosis    The results of significant diagnostics from this hospitalization (including imaging, microbiology, ancillary and laboratory) are listed below for reference.  Microbiology: Recent Results (from the past 240 hour(s))  Urine culture     Status: Abnormal   Collection Time: 07/31/17  1:44 PM  Result Value Ref Range Status   Specimen Description URINE, CLEAN CATCH  Final   Special Requests NONE  Final   Culture >=100,000 COLONIES/mL ESCHERICHIA COLI (A)  Final   Report Status 08/02/2017 FINAL  Final   Organism ID, Bacteria ESCHERICHIA COLI (A)  Final      Susceptibility   Escherichia coli - MIC*    AMPICILLIN <=2 SENSITIVE Sensitive     CEFAZOLIN <=4 SENSITIVE Sensitive     CEFTRIAXONE <=1 SENSITIVE Sensitive     CIPROFLOXACIN <=0.25 SENSITIVE Sensitive     GENTAMICIN <=1 SENSITIVE Sensitive     IMIPENEM <=0.25 SENSITIVE Sensitive      NITROFURANTOIN <=16 SENSITIVE Sensitive     TRIMETH/SULFA >=320 RESISTANT Resistant     AMPICILLIN/SULBACTAM <=2 SENSITIVE Sensitive     PIP/TAZO <=4 SENSITIVE Sensitive     Extended ESBL NEGATIVE Sensitive     * >=100,000 COLONIES/mL ESCHERICHIA COLI  Culture, blood (routine x 2)     Status: None (Preliminary result)   Collection Time: 07/31/17  2:34 PM  Result Value Ref Range Status   Specimen Description BLOOD LEFT FOREARM  Final   Special Requests   Final    BOTTLES DRAWN AEROBIC AND ANAEROBIC Blood Culture adequate volume   Culture   Final    NO GROWTH 2 DAYS Performed at [facility], [address] [place], [place] [address]    Report Status PENDING  Incomplete  Culture, blood (routine x 2)     Status: None (Preliminary result)   Collection Time: 07/31/17  5:31 PM  Result Value Ref Range Status   Specimen Description BLOOD RIGHT ARM  Final   Special Requests   Final    BOTTLES DRAWN AEROBIC ONLY Blood Culture adequate volume   Culture   Final    NO GROWTH 2 DAYS Performed at [facility], [place] [address] [place],  [address]    Report Status PENDING  Incomplete  MRSA PCR Screening     Status: None   Collection Time: 07/31/17  6:25 PM  Result Value Ref Range Status   MRSA by PCR NEGATIVE NEGATIVE Final    Comment:        The GeneXpert MRSA Assay (FDA approved for NASAL specimens only), is one component of a comprehensive MRSA colonization surveillance program. It is not intended to diagnose MRSA infection nor to guide or monitor treatment for MRSA infections.      Labs: BNP (last 3 results) No results for input(s): BNP in the last 8760 hours. Basic Metabolic Panel:  Recent Labs Lab 07/31/17 1330 08/01/17 0411  NA 138 138  K 4.0 3.8  CL 106 108  CO2 25 24  GLUCOSE 135* 87  BUN 26* 20  CREATININE 1.45* 1.31*  CALCIUM 8.5* 8.4*   Liver Function Tests:  Recent Labs Lab 07/31/17 1330 08/01/17 0411  AST 22 19  ALT 19 18   ALKPHOS 62 55  BILITOT 0.5 0.6  PROT 6.5 6.2*  ALBUMIN 3.0* 2.8*   No results for input(s): LIPASE, AMYLASE in the last 168 hours. No results for input(s): AMMONIA in the last 168 hours. CBC:  Recent Labs Lab 07/31/17 1330 08/01/17 0411  WBC 12.3* 13.7*  NEUTROABS 9.8*  --   HGB 12.8* 12.5*  HCT 39.1 38.8*  MCV 88.1 89.6  PLT  249 257   Cardiac Enzymes: No results for input(s): CKTOTAL, CKMB, CKMBINDEX, TROPONINI in the last 168 hours. BNP: Invalid input(s): POCBNP CBG: No results for input(s): GLUCAP in the last 168 hours. D-Dimer No results for input(s): DDIMER in the last 72 hours. Hgb A1c No results for input(s): HGBA1C in the last 72 hours. Lipid Profile No results for input(s): CHOL, HDL, LDLCALC, TRIG, CHOLHDL, LDLDIRECT in the last 72 hours. Thyroid function studies No results for input(s): TSH, T4TOTAL, T3FREE, THYROIDAB in the last 72 hours.  Invalid input(s): FREET3 Anemia work up No results for input(s): VITAMINB12, FOLATE, FERRITIN, TIBC, IRON, RETICCTPCT in the last 72 hours. Urinalysis    Component Value Date/Time   COLORURINE YELLOW 07/31/2017 1344   APPEARANCEUR CLOUDY (A) 07/31/2017 1344   LABSPEC 1.014 07/31/2017 1344   PHURINE 6.0 07/31/2017 1344   GLUCOSEU NEGATIVE 07/31/2017 1344   HGBUR MODERATE (A) 07/31/2017 1344   BILIRUBINUR NEGATIVE 07/31/2017 1344   KETONESUR NEGATIVE 07/31/2017 1344   PROTEINUR 30 (A) 07/31/2017 1344   NITRITE NEGATIVE 07/31/2017 1344   LEUKOCYTESUR LARGE (A) 07/31/2017 1344   Sepsis Labs Invalid input(s): PROCALCITONIN,  WBC,  LACTICIDVEN Microbiology Recent Results (from the past 240 hour(s))  Urine culture     Status: Abnormal   Collection Time: 07/31/17  1:44 PM  Result Value Ref Range Status   Specimen Description URINE, CLEAN CATCH  Final   Special Requests NONE  Final   Culture >=100,000 COLONIES/mL ESCHERICHIA COLI (A)  Final   Report Status 08/02/2017 FINAL  Final   Organism ID, Bacteria  ESCHERICHIA COLI (A)  Final      Susceptibility   Escherichia coli - MIC*    AMPICILLIN <=2 SENSITIVE Sensitive     CEFAZOLIN <=4 SENSITIVE Sensitive     CEFTRIAXONE <=1 SENSITIVE Sensitive     CIPROFLOXACIN <=0.25 SENSITIVE Sensitive     GENTAMICIN <=1 SENSITIVE Sensitive     IMIPENEM <=0.25 SENSITIVE Sensitive     NITROFURANTOIN <=16 SENSITIVE Sensitive     TRIMETH/SULFA >=320 RESISTANT Resistant     AMPICILLIN/SULBACTAM <=2 SENSITIVE Sensitive     PIP/TAZO <=4 SENSITIVE Sensitive     Extended ESBL NEGATIVE Sensitive     * >=100,000 COLONIES/mL ESCHERICHIA COLI  Culture, blood (routine x 2)     Status: None (Preliminary result)   Collection Time: 07/31/17  2:34 PM  Result Value Ref Range Status   Specimen Description BLOOD LEFT FOREARM  Final   Special Requests   Final    BOTTLES DRAWN AEROBIC AND ANAEROBIC Blood Culture adequate volume   Culture   Final    NO GROWTH 2 DAYS Performed at [facility], [address], [place], [place] [address]    Report Status PENDING  Incomplete  Culture, blood (routine x 2)     Status: None (Preliminary result)   Collection Time: 07/31/17  5:31 PM  Result Value Ref Range Status   Specimen Description BLOOD RIGHT ARM  Final   Special Requests   Final    BOTTLES DRAWN AEROBIC ONLY Blood Culture adequate volume   Culture   Final    NO GROWTH 2 DAYS Performed at [facility], [place] [address] [place], [place] [address]    Report Status PENDING  Incomplete  MRSA PCR Screening     Status: None   Collection Time: 07/31/17  6:25 PM  Result Value Ref Range Status   MRSA by PCR NEGATIVE NEGATIVE Final    Comment:  The GeneXpert MRSA Assay (FDA approved for NASAL specimens only), is one component of a comprehensive MRSA colonization surveillance program. It is not intended to diagnose MRSA infection nor to guide or monitor treatment for MRSA infections.      Time coordinating discharge: Over 30  minutes  SIGNED:   Georgette Shell, MD  [facility] 08/02/2017, 11:44 AM  If 7PM-7AM, please contact night-coverage [email or web address] Password [email or web address]

## 2017-08-02 NOTE — Consult Note (Signed)
   [facility] CM Inpatient Consult   08/02/2017  CLIFFORD BENNINGER 27-Mar-1925 [id number]    Patient screened for potential [facility] Care Management services.   Chart reviewed. Noted discharge plan is to return to SNF.  No identifiable [facility] Care Management needs at this time.     Marthenia Rolling, MSN-Ed, RN,BSN [facility] Liaison [phone]

## 2017-08-02 NOTE — Progress Notes (Signed)
OT Cancellation Note  Patient Details Name: Mitchell [name] MRN: [id number] DOB: 1924/12/02   Cancelled Treatment:    Reason Eval/Treat Not Completed: Other (comment) ' Noted plan is SNF. Will defer to SNF   [facility], Thereasa Parkin 08/02/2017, 12:26 PM

## 2017-08-03 DIAGNOSIS — N390 Urinary tract infection, site not specified: Secondary | ICD-10-CM | POA: Diagnosis not present

## 2017-08-03 DIAGNOSIS — E560 Deficiency of vitamin E: Secondary | ICD-10-CM | POA: Diagnosis not present

## 2017-08-03 DIAGNOSIS — M6281 Muscle weakness (generalized): Secondary | ICD-10-CM | POA: Diagnosis not present

## 2017-08-03 DIAGNOSIS — I6202 Nontraumatic subacute subdural hemorrhage: Secondary | ICD-10-CM | POA: Diagnosis not present

## 2017-08-03 DIAGNOSIS — A419 Sepsis, unspecified organism: Secondary | ICD-10-CM | POA: Diagnosis not present

## 2017-08-03 DIAGNOSIS — N400 Enlarged prostate without lower urinary tract symptoms: Secondary | ICD-10-CM | POA: Diagnosis not present

## 2017-08-03 DIAGNOSIS — F329 Major depressive disorder, single episode, unspecified: Secondary | ICD-10-CM | POA: Diagnosis not present

## 2017-08-03 DIAGNOSIS — R278 Other lack of coordination: Secondary | ICD-10-CM | POA: Diagnosis not present

## 2017-08-03 DIAGNOSIS — N399 Disorder of urinary system, unspecified: Secondary | ICD-10-CM | POA: Diagnosis not present

## 2017-08-05 DIAGNOSIS — R278 Other lack of coordination: Secondary | ICD-10-CM | POA: Diagnosis not present

## 2017-08-05 DIAGNOSIS — M6281 Muscle weakness (generalized): Secondary | ICD-10-CM | POA: Diagnosis not present

## 2017-08-05 DIAGNOSIS — A419 Sepsis, unspecified organism: Secondary | ICD-10-CM | POA: Diagnosis not present

## 2017-08-05 LAB — CULTURE, BLOOD (ROUTINE X 2)
Culture: NO GROWTH
Culture: NO GROWTH
SPECIAL REQUESTS: ADEQUATE
SPECIAL REQUESTS: ADEQUATE

## 2017-08-06 DIAGNOSIS — M6281 Muscle weakness (generalized): Secondary | ICD-10-CM | POA: Diagnosis not present

## 2017-08-06 DIAGNOSIS — R278 Other lack of coordination: Secondary | ICD-10-CM | POA: Diagnosis not present

## 2017-08-06 DIAGNOSIS — A419 Sepsis, unspecified organism: Secondary | ICD-10-CM | POA: Diagnosis not present

## 2017-08-07 DIAGNOSIS — R278 Other lack of coordination: Secondary | ICD-10-CM | POA: Diagnosis not present

## 2017-08-07 DIAGNOSIS — A419 Sepsis, unspecified organism: Secondary | ICD-10-CM | POA: Diagnosis not present

## 2017-08-07 DIAGNOSIS — M6281 Muscle weakness (generalized): Secondary | ICD-10-CM | POA: Diagnosis not present

## 2017-08-08 DIAGNOSIS — S91209A Unspecified open wound of unspecified toe(s) with damage to nail, initial encounter: Secondary | ICD-10-CM | POA: Diagnosis not present

## 2017-08-08 DIAGNOSIS — A419 Sepsis, unspecified organism: Secondary | ICD-10-CM | POA: Diagnosis not present

## 2017-08-08 DIAGNOSIS — R278 Other lack of coordination: Secondary | ICD-10-CM | POA: Diagnosis not present

## 2017-08-08 DIAGNOSIS — M6281 Muscle weakness (generalized): Secondary | ICD-10-CM | POA: Diagnosis not present

## 2017-08-09 DIAGNOSIS — A419 Sepsis, unspecified organism: Secondary | ICD-10-CM | POA: Diagnosis not present

## 2017-08-09 DIAGNOSIS — M6281 Muscle weakness (generalized): Secondary | ICD-10-CM | POA: Diagnosis not present

## 2017-08-09 DIAGNOSIS — R278 Other lack of coordination: Secondary | ICD-10-CM | POA: Diagnosis not present

## 2017-08-10 DIAGNOSIS — M6281 Muscle weakness (generalized): Secondary | ICD-10-CM | POA: Diagnosis not present

## 2017-08-10 DIAGNOSIS — R278 Other lack of coordination: Secondary | ICD-10-CM | POA: Diagnosis not present

## 2017-08-10 DIAGNOSIS — A419 Sepsis, unspecified organism: Secondary | ICD-10-CM | POA: Diagnosis not present

## 2017-08-13 DIAGNOSIS — M6281 Muscle weakness (generalized): Secondary | ICD-10-CM | POA: Diagnosis not present

## 2017-08-13 DIAGNOSIS — F0391 Unspecified dementia with behavioral disturbance: Secondary | ICD-10-CM | POA: Diagnosis not present

## 2017-08-13 DIAGNOSIS — A419 Sepsis, unspecified organism: Secondary | ICD-10-CM | POA: Diagnosis not present

## 2017-08-13 DIAGNOSIS — R278 Other lack of coordination: Secondary | ICD-10-CM | POA: Diagnosis not present

## 2017-08-14 DIAGNOSIS — A419 Sepsis, unspecified organism: Secondary | ICD-10-CM | POA: Diagnosis not present

## 2017-08-14 DIAGNOSIS — R278 Other lack of coordination: Secondary | ICD-10-CM | POA: Diagnosis not present

## 2017-08-14 DIAGNOSIS — M6281 Muscle weakness (generalized): Secondary | ICD-10-CM | POA: Diagnosis not present

## 2017-08-15 DIAGNOSIS — M6281 Muscle weakness (generalized): Secondary | ICD-10-CM | POA: Diagnosis not present

## 2017-08-15 DIAGNOSIS — R278 Other lack of coordination: Secondary | ICD-10-CM | POA: Diagnosis not present

## 2017-08-15 DIAGNOSIS — A419 Sepsis, unspecified organism: Secondary | ICD-10-CM | POA: Diagnosis not present

## 2017-08-16 DIAGNOSIS — M79675 Pain in left toe(s): Secondary | ICD-10-CM | POA: Diagnosis not present

## 2017-08-16 DIAGNOSIS — M6281 Muscle weakness (generalized): Secondary | ICD-10-CM | POA: Diagnosis not present

## 2017-08-16 DIAGNOSIS — R278 Other lack of coordination: Secondary | ICD-10-CM | POA: Diagnosis not present

## 2017-08-16 DIAGNOSIS — M79674 Pain in right toe(s): Secondary | ICD-10-CM | POA: Diagnosis not present

## 2017-08-16 DIAGNOSIS — B351 Tinea unguium: Secondary | ICD-10-CM | POA: Diagnosis not present

## 2017-08-16 DIAGNOSIS — A419 Sepsis, unspecified organism: Secondary | ICD-10-CM | POA: Diagnosis not present

## 2017-08-16 DIAGNOSIS — I739 Peripheral vascular disease, unspecified: Secondary | ICD-10-CM | POA: Diagnosis not present

## 2017-08-16 DIAGNOSIS — L603 Nail dystrophy: Secondary | ICD-10-CM | POA: Diagnosis not present

## 2017-08-16 DIAGNOSIS — L97522 Non-pressure chronic ulcer of other part of left foot with fat layer exposed: Secondary | ICD-10-CM | POA: Diagnosis not present

## 2017-08-17 DIAGNOSIS — R278 Other lack of coordination: Secondary | ICD-10-CM | POA: Diagnosis not present

## 2017-08-17 DIAGNOSIS — M6281 Muscle weakness (generalized): Secondary | ICD-10-CM | POA: Diagnosis not present

## 2017-08-17 DIAGNOSIS — A419 Sepsis, unspecified organism: Secondary | ICD-10-CM | POA: Diagnosis not present

## 2017-08-19 DIAGNOSIS — R278 Other lack of coordination: Secondary | ICD-10-CM | POA: Diagnosis not present

## 2017-08-19 DIAGNOSIS — A419 Sepsis, unspecified organism: Secondary | ICD-10-CM | POA: Diagnosis not present

## 2017-08-19 DIAGNOSIS — M6281 Muscle weakness (generalized): Secondary | ICD-10-CM | POA: Diagnosis not present

## 2017-08-20 DIAGNOSIS — M6281 Muscle weakness (generalized): Secondary | ICD-10-CM | POA: Diagnosis not present

## 2017-08-20 DIAGNOSIS — A419 Sepsis, unspecified organism: Secondary | ICD-10-CM | POA: Diagnosis not present

## 2017-08-20 DIAGNOSIS — R278 Other lack of coordination: Secondary | ICD-10-CM | POA: Diagnosis not present

## 2017-08-21 DIAGNOSIS — R278 Other lack of coordination: Secondary | ICD-10-CM | POA: Diagnosis not present

## 2017-08-21 DIAGNOSIS — A419 Sepsis, unspecified organism: Secondary | ICD-10-CM | POA: Diagnosis not present

## 2017-08-21 DIAGNOSIS — M6281 Muscle weakness (generalized): Secondary | ICD-10-CM | POA: Diagnosis not present

## 2017-08-22 DIAGNOSIS — A419 Sepsis, unspecified organism: Secondary | ICD-10-CM | POA: Diagnosis not present

## 2017-08-22 DIAGNOSIS — M6281 Muscle weakness (generalized): Secondary | ICD-10-CM | POA: Diagnosis not present

## 2017-08-22 DIAGNOSIS — R278 Other lack of coordination: Secondary | ICD-10-CM | POA: Diagnosis not present

## 2017-08-23 DIAGNOSIS — A419 Sepsis, unspecified organism: Secondary | ICD-10-CM | POA: Diagnosis not present

## 2017-08-23 DIAGNOSIS — M6281 Muscle weakness (generalized): Secondary | ICD-10-CM | POA: Diagnosis not present

## 2017-08-23 DIAGNOSIS — R278 Other lack of coordination: Secondary | ICD-10-CM | POA: Diagnosis not present

## 2017-08-24 ENCOUNTER — Ambulatory Visit: Payer: Self-pay | Admitting: Podiatry

## 2017-08-24 DIAGNOSIS — R278 Other lack of coordination: Secondary | ICD-10-CM | POA: Diagnosis not present

## 2017-08-24 DIAGNOSIS — M6281 Muscle weakness (generalized): Secondary | ICD-10-CM | POA: Diagnosis not present

## 2017-08-24 DIAGNOSIS — A419 Sepsis, unspecified organism: Secondary | ICD-10-CM | POA: Diagnosis not present

## 2017-08-27 DIAGNOSIS — M6281 Muscle weakness (generalized): Secondary | ICD-10-CM | POA: Diagnosis not present

## 2017-08-27 DIAGNOSIS — R278 Other lack of coordination: Secondary | ICD-10-CM | POA: Diagnosis not present

## 2017-08-27 DIAGNOSIS — A419 Sepsis, unspecified organism: Secondary | ICD-10-CM | POA: Diagnosis not present

## 2017-08-28 DIAGNOSIS — R278 Other lack of coordination: Secondary | ICD-10-CM | POA: Diagnosis not present

## 2017-08-28 DIAGNOSIS — M6281 Muscle weakness (generalized): Secondary | ICD-10-CM | POA: Diagnosis not present

## 2017-08-28 DIAGNOSIS — A419 Sepsis, unspecified organism: Secondary | ICD-10-CM | POA: Diagnosis not present

## 2017-08-29 DIAGNOSIS — A419 Sepsis, unspecified organism: Secondary | ICD-10-CM | POA: Diagnosis not present

## 2017-08-29 DIAGNOSIS — R278 Other lack of coordination: Secondary | ICD-10-CM | POA: Diagnosis not present

## 2017-08-29 DIAGNOSIS — M6281 Muscle weakness (generalized): Secondary | ICD-10-CM | POA: Diagnosis not present

## 2017-08-30 DIAGNOSIS — M6281 Muscle weakness (generalized): Secondary | ICD-10-CM | POA: Diagnosis not present

## 2017-08-30 DIAGNOSIS — Z79899 Other long term (current) drug therapy: Secondary | ICD-10-CM | POA: Diagnosis not present

## 2017-08-30 DIAGNOSIS — N390 Urinary tract infection, site not specified: Secondary | ICD-10-CM | POA: Diagnosis not present

## 2017-08-30 DIAGNOSIS — R319 Hematuria, unspecified: Secondary | ICD-10-CM | POA: Diagnosis not present

## 2017-08-30 DIAGNOSIS — A419 Sepsis, unspecified organism: Secondary | ICD-10-CM | POA: Diagnosis not present

## 2017-08-30 DIAGNOSIS — R278 Other lack of coordination: Secondary | ICD-10-CM | POA: Diagnosis not present

## 2017-08-31 DIAGNOSIS — R451 Restlessness and agitation: Secondary | ICD-10-CM | POA: Diagnosis not present

## 2017-08-31 DIAGNOSIS — A419 Sepsis, unspecified organism: Secondary | ICD-10-CM | POA: Diagnosis not present

## 2017-08-31 DIAGNOSIS — M6281 Muscle weakness (generalized): Secondary | ICD-10-CM | POA: Diagnosis not present

## 2017-08-31 DIAGNOSIS — R278 Other lack of coordination: Secondary | ICD-10-CM | POA: Diagnosis not present

## 2017-09-03 DIAGNOSIS — M6281 Muscle weakness (generalized): Secondary | ICD-10-CM | POA: Diagnosis not present

## 2017-09-03 DIAGNOSIS — A419 Sepsis, unspecified organism: Secondary | ICD-10-CM | POA: Diagnosis not present

## 2017-09-03 DIAGNOSIS — R278 Other lack of coordination: Secondary | ICD-10-CM | POA: Diagnosis not present

## 2017-09-04 DIAGNOSIS — A419 Sepsis, unspecified organism: Secondary | ICD-10-CM | POA: Diagnosis not present

## 2017-09-04 DIAGNOSIS — M6281 Muscle weakness (generalized): Secondary | ICD-10-CM | POA: Diagnosis not present

## 2017-09-04 DIAGNOSIS — R278 Other lack of coordination: Secondary | ICD-10-CM | POA: Diagnosis not present

## 2017-09-05 DIAGNOSIS — N400 Enlarged prostate without lower urinary tract symptoms: Secondary | ICD-10-CM | POA: Diagnosis not present

## 2017-09-05 DIAGNOSIS — A419 Sepsis, unspecified organism: Secondary | ICD-10-CM | POA: Diagnosis not present

## 2017-09-05 DIAGNOSIS — F419 Anxiety disorder, unspecified: Secondary | ICD-10-CM | POA: Diagnosis not present

## 2017-09-05 DIAGNOSIS — N390 Urinary tract infection, site not specified: Secondary | ICD-10-CM | POA: Diagnosis not present

## 2017-09-05 DIAGNOSIS — F0281 Dementia in other diseases classified elsewhere with behavioral disturbance: Secondary | ICD-10-CM | POA: Diagnosis not present

## 2017-09-05 DIAGNOSIS — M6281 Muscle weakness (generalized): Secondary | ICD-10-CM | POA: Diagnosis not present

## 2017-09-05 DIAGNOSIS — R54 Age-related physical debility: Secondary | ICD-10-CM | POA: Diagnosis not present

## 2017-09-05 DIAGNOSIS — F329 Major depressive disorder, single episode, unspecified: Secondary | ICD-10-CM | POA: Diagnosis not present

## 2017-09-05 DIAGNOSIS — R278 Other lack of coordination: Secondary | ICD-10-CM | POA: Diagnosis not present

## 2017-09-05 DIAGNOSIS — N399 Disorder of urinary system, unspecified: Secondary | ICD-10-CM | POA: Diagnosis not present

## 2017-09-05 DIAGNOSIS — N3281 Overactive bladder: Secondary | ICD-10-CM | POA: Diagnosis not present

## 2017-09-05 DIAGNOSIS — E785 Hyperlipidemia, unspecified: Secondary | ICD-10-CM | POA: Diagnosis not present

## 2017-09-05 DIAGNOSIS — E569 Vitamin deficiency, unspecified: Secondary | ICD-10-CM | POA: Diagnosis not present

## 2017-09-06 DIAGNOSIS — R278 Other lack of coordination: Secondary | ICD-10-CM | POA: Diagnosis not present

## 2017-09-06 DIAGNOSIS — A419 Sepsis, unspecified organism: Secondary | ICD-10-CM | POA: Diagnosis not present

## 2017-09-06 DIAGNOSIS — M6281 Muscle weakness (generalized): Secondary | ICD-10-CM | POA: Diagnosis not present

## 2017-09-07 DIAGNOSIS — R278 Other lack of coordination: Secondary | ICD-10-CM | POA: Diagnosis not present

## 2017-09-07 DIAGNOSIS — A419 Sepsis, unspecified organism: Secondary | ICD-10-CM | POA: Diagnosis not present

## 2017-09-07 DIAGNOSIS — M6281 Muscle weakness (generalized): Secondary | ICD-10-CM | POA: Diagnosis not present

## 2017-09-10 DIAGNOSIS — A419 Sepsis, unspecified organism: Secondary | ICD-10-CM | POA: Diagnosis not present

## 2017-09-10 DIAGNOSIS — M6281 Muscle weakness (generalized): Secondary | ICD-10-CM | POA: Diagnosis not present

## 2017-09-10 DIAGNOSIS — F329 Major depressive disorder, single episode, unspecified: Secondary | ICD-10-CM | POA: Diagnosis not present

## 2017-09-10 DIAGNOSIS — R278 Other lack of coordination: Secondary | ICD-10-CM | POA: Diagnosis not present

## 2017-09-11 DIAGNOSIS — R278 Other lack of coordination: Secondary | ICD-10-CM | POA: Diagnosis not present

## 2017-09-11 DIAGNOSIS — A419 Sepsis, unspecified organism: Secondary | ICD-10-CM | POA: Diagnosis not present

## 2017-09-11 DIAGNOSIS — M6281 Muscle weakness (generalized): Secondary | ICD-10-CM | POA: Diagnosis not present

## 2017-09-12 DIAGNOSIS — A419 Sepsis, unspecified organism: Secondary | ICD-10-CM | POA: Diagnosis not present

## 2017-09-12 DIAGNOSIS — R278 Other lack of coordination: Secondary | ICD-10-CM | POA: Diagnosis not present

## 2017-09-12 DIAGNOSIS — M6281 Muscle weakness (generalized): Secondary | ICD-10-CM | POA: Diagnosis not present

## 2017-09-13 DIAGNOSIS — R278 Other lack of coordination: Secondary | ICD-10-CM | POA: Diagnosis not present

## 2017-09-13 DIAGNOSIS — M6281 Muscle weakness (generalized): Secondary | ICD-10-CM | POA: Diagnosis not present

## 2017-09-13 DIAGNOSIS — A419 Sepsis, unspecified organism: Secondary | ICD-10-CM | POA: Diagnosis not present

## 2017-09-14 DIAGNOSIS — R278 Other lack of coordination: Secondary | ICD-10-CM | POA: Diagnosis not present

## 2017-09-14 DIAGNOSIS — M6281 Muscle weakness (generalized): Secondary | ICD-10-CM | POA: Diagnosis not present

## 2017-09-14 DIAGNOSIS — A419 Sepsis, unspecified organism: Secondary | ICD-10-CM | POA: Diagnosis not present

## 2017-09-17 DIAGNOSIS — R278 Other lack of coordination: Secondary | ICD-10-CM | POA: Diagnosis not present

## 2017-09-17 DIAGNOSIS — A419 Sepsis, unspecified organism: Secondary | ICD-10-CM | POA: Diagnosis not present

## 2017-09-17 DIAGNOSIS — M6281 Muscle weakness (generalized): Secondary | ICD-10-CM | POA: Diagnosis not present

## 2017-09-18 DIAGNOSIS — A419 Sepsis, unspecified organism: Secondary | ICD-10-CM | POA: Diagnosis not present

## 2017-09-18 DIAGNOSIS — M6281 Muscle weakness (generalized): Secondary | ICD-10-CM | POA: Diagnosis not present

## 2017-09-18 DIAGNOSIS — R278 Other lack of coordination: Secondary | ICD-10-CM | POA: Diagnosis not present

## 2017-09-19 DIAGNOSIS — A419 Sepsis, unspecified organism: Secondary | ICD-10-CM | POA: Diagnosis not present

## 2017-09-19 DIAGNOSIS — R278 Other lack of coordination: Secondary | ICD-10-CM | POA: Diagnosis not present

## 2017-09-19 DIAGNOSIS — M6281 Muscle weakness (generalized): Secondary | ICD-10-CM | POA: Diagnosis not present

## 2017-09-27 DIAGNOSIS — M79674 Pain in right toe(s): Secondary | ICD-10-CM | POA: Diagnosis not present

## 2017-09-27 DIAGNOSIS — B351 Tinea unguium: Secondary | ICD-10-CM | POA: Diagnosis not present

## 2017-09-27 DIAGNOSIS — T8189XA Other complications of procedures, not elsewhere classified, initial encounter: Secondary | ICD-10-CM | POA: Diagnosis not present

## 2017-09-27 DIAGNOSIS — L600 Ingrowing nail: Secondary | ICD-10-CM | POA: Diagnosis not present

## 2017-09-27 DIAGNOSIS — M79675 Pain in left toe(s): Secondary | ICD-10-CM | POA: Diagnosis not present

## 2017-10-05 DIAGNOSIS — I6202 Nontraumatic subacute subdural hemorrhage: Secondary | ICD-10-CM | POA: Diagnosis not present

## 2017-10-05 DIAGNOSIS — N399 Disorder of urinary system, unspecified: Secondary | ICD-10-CM | POA: Diagnosis not present

## 2017-10-05 DIAGNOSIS — E569 Vitamin deficiency, unspecified: Secondary | ICD-10-CM | POA: Diagnosis not present

## 2017-10-05 DIAGNOSIS — N390 Urinary tract infection, site not specified: Secondary | ICD-10-CM | POA: Diagnosis not present

## 2017-10-05 DIAGNOSIS — E785 Hyperlipidemia, unspecified: Secondary | ICD-10-CM | POA: Diagnosis not present

## 2017-10-05 DIAGNOSIS — F329 Major depressive disorder, single episode, unspecified: Secondary | ICD-10-CM | POA: Diagnosis not present

## 2017-10-05 DIAGNOSIS — F0281 Dementia in other diseases classified elsewhere with behavioral disturbance: Secondary | ICD-10-CM | POA: Diagnosis not present

## 2017-10-05 DIAGNOSIS — R54 Age-related physical debility: Secondary | ICD-10-CM | POA: Diagnosis not present

## 2017-10-05 DIAGNOSIS — E560 Deficiency of vitamin E: Secondary | ICD-10-CM | POA: Diagnosis not present

## 2017-10-05 DIAGNOSIS — N400 Enlarged prostate without lower urinary tract symptoms: Secondary | ICD-10-CM | POA: Diagnosis not present

## 2017-10-05 DIAGNOSIS — N3281 Overactive bladder: Secondary | ICD-10-CM | POA: Diagnosis not present

## 2017-10-05 DIAGNOSIS — M6281 Muscle weakness (generalized): Secondary | ICD-10-CM | POA: Diagnosis not present

## 2017-10-08 DIAGNOSIS — I6202 Nontraumatic subacute subdural hemorrhage: Secondary | ICD-10-CM | POA: Diagnosis not present

## 2017-10-08 DIAGNOSIS — F419 Anxiety disorder, unspecified: Secondary | ICD-10-CM | POA: Diagnosis not present

## 2017-10-08 DIAGNOSIS — N3281 Overactive bladder: Secondary | ICD-10-CM | POA: Diagnosis not present

## 2017-10-08 DIAGNOSIS — F329 Major depressive disorder, single episode, unspecified: Secondary | ICD-10-CM | POA: Diagnosis not present

## 2017-10-08 DIAGNOSIS — N390 Urinary tract infection, site not specified: Secondary | ICD-10-CM | POA: Diagnosis not present

## 2017-10-08 DIAGNOSIS — E785 Hyperlipidemia, unspecified: Secondary | ICD-10-CM | POA: Diagnosis not present

## 2017-10-08 DIAGNOSIS — R4182 Altered mental status, unspecified: Secondary | ICD-10-CM | POA: Diagnosis not present

## 2017-10-08 DIAGNOSIS — F0281 Dementia in other diseases classified elsewhere with behavioral disturbance: Secondary | ICD-10-CM | POA: Diagnosis not present

## 2017-10-08 DIAGNOSIS — N400 Enlarged prostate without lower urinary tract symptoms: Secondary | ICD-10-CM | POA: Diagnosis not present

## 2017-10-08 DIAGNOSIS — N399 Disorder of urinary system, unspecified: Secondary | ICD-10-CM | POA: Diagnosis not present

## 2017-10-08 DIAGNOSIS — R54 Age-related physical debility: Secondary | ICD-10-CM | POA: Diagnosis not present

## 2017-10-10 ENCOUNTER — Emergency Department (HOSPITAL_COMMUNITY)
Admission: EM | Admit: 2017-10-10 | Discharge: 2017-10-11 | Disposition: A | Discharge status: Home / Self Care | Payer: Medicare Other | Attending: Emergency Medicine | Admitting: Emergency Medicine

## 2017-10-10 ENCOUNTER — Emergency Department (HOSPITAL_COMMUNITY): Payer: Medicare Other

## 2017-10-10 ENCOUNTER — Encounter (HOSPITAL_COMMUNITY): Payer: Self-pay | Admitting: Emergency Medicine

## 2017-10-10 DIAGNOSIS — R4182 Altered mental status, unspecified: Secondary | ICD-10-CM | POA: Diagnosis not present

## 2017-10-10 DIAGNOSIS — Z7982 Long term (current) use of aspirin: Secondary | ICD-10-CM | POA: Insufficient documentation

## 2017-10-10 DIAGNOSIS — F17210 Nicotine dependence, cigarettes, uncomplicated: Secondary | ICD-10-CM | POA: Insufficient documentation

## 2017-10-10 DIAGNOSIS — G912 (Idiopathic) normal pressure hydrocephalus: Secondary | ICD-10-CM | POA: Insufficient documentation

## 2017-10-10 DIAGNOSIS — R0902 Hypoxemia: Secondary | ICD-10-CM | POA: Diagnosis not present

## 2017-10-10 DIAGNOSIS — Z79899 Other long term (current) drug therapy: Secondary | ICD-10-CM | POA: Diagnosis not present

## 2017-10-10 DIAGNOSIS — R079 Chest pain, unspecified: Secondary | ICD-10-CM | POA: Diagnosis not present

## 2017-10-10 LAB — I-STAT TROPONIN, ED: Troponin i, poc: 0 ng/mL (ref 0.00–0.08)

## 2017-10-10 LAB — URINALYSIS, ROUTINE W REFLEX MICROSCOPIC
BILIRUBIN URINE: NEGATIVE
Glucose, UA: NEGATIVE mg/dL
KETONES UR: NEGATIVE mg/dL
Nitrite: POSITIVE — AB
PH: 5 (ref 5.0–8.0)
Protein, ur: NEGATIVE mg/dL
SPECIFIC GRAVITY, URINE: 1.017 (ref 1.005–1.030)
SQUAMOUS EPITHELIAL / LPF: NONE SEEN

## 2017-10-10 LAB — COMPREHENSIVE METABOLIC PANEL
ALBUMIN: 3.3 g/dL — AB (ref 3.5–5.0)
ALT: 19 U/L (ref 17–63)
AST: 25 U/L (ref 15–41)
Alkaline Phosphatase: 61 U/L (ref 38–126)
Anion gap: 8 (ref 5–15)
BUN: 40 mg/dL — AB (ref 6–20)
CHLORIDE: 107 mmol/L (ref 101–111)
CO2: 27 mmol/L (ref 22–32)
CREATININE: 1.87 mg/dL — AB (ref 0.61–1.24)
Calcium: 8.9 mg/dL (ref 8.9–10.3)
GFR calc Af Amer: 35 mL/min — ABNORMAL LOW (ref >60)
GFR calc non Af Amer: 30 mL/min — ABNORMAL LOW (ref >60)
GLUCOSE: 93 mg/dL (ref 65–99)
POTASSIUM: 4.2 mmol/L (ref 3.5–5.1)
Sodium: 142 mmol/L (ref 135–145)
Total Bilirubin: 0.7 mg/dL (ref 0.3–1.2)
Total Protein: 6.9 g/dL (ref 6.5–8.1)

## 2017-10-10 LAB — CBC
HCT: 43.9 % (ref 39.0–52.0)
Hemoglobin: 14.4 g/dL (ref 13.0–17.0)
MCH: 29.4 pg (ref 26.0–34.0)
MCHC: 32.8 g/dL (ref 30.0–36.0)
MCV: 89.6 fL (ref 78.0–100.0)
Platelets: 189 10*3/uL (ref 150–400)
RBC: 4.9 MIL/uL (ref 4.22–5.81)
RDW: 14.8 % (ref 11.5–15.5)
WBC: 11 10*3/uL — ABNORMAL HIGH (ref 4.0–10.5)

## 2017-10-10 LAB — DIFFERENTIAL
BASOS ABS: 0 10*3/uL (ref 0.0–0.1)
BASOS PCT: 0 %
EOS ABS: 0.2 10*3/uL (ref 0.0–0.7)
Eosinophils Relative: 2 %
LYMPHS ABS: 1.6 10*3/uL (ref 0.7–4.0)
Lymphocytes Relative: 15 %
MONOS PCT: 12 %
Monocytes Absolute: 1.3 10*3/uL — ABNORMAL HIGH (ref 0.1–1.0)
NEUTROS PCT: 71 %
Neutro Abs: 8 10*3/uL — ABNORMAL HIGH (ref 1.7–7.7)

## 2017-10-10 LAB — CBG MONITORING, ED: GLUCOSE-CAPILLARY: 75 mg/dL (ref 65–99)

## 2017-10-10 LAB — I-STAT CG4 LACTIC ACID, ED: LACTIC ACID, VENOUS: 1.25 mmol/L (ref 0.5–1.9)

## 2017-10-10 MED ORDER — DEXTROSE 50 % IV SOLN
25.0000 g | Freq: Once | INTRAVENOUS | Status: AC
  Administered 2017-10-10: 25 g via INTRAVENOUS
  Filled 2017-10-10: qty 50

## 2017-10-10 MED ORDER — SODIUM CHLORIDE 0.9 % IV BOLUS (SEPSIS)
1000.0000 mL | Freq: Once | INTRAVENOUS | Status: AC
  Administered 2017-10-10: 1000 mL via INTRAVENOUS

## 2017-10-10 MED ORDER — DEXTROSE 5 % IV SOLN
1.0000 g | Freq: Once | INTRAVENOUS | Status: AC
  Administered 2017-10-10: 1 g via INTRAVENOUS
  Filled 2017-10-10: qty 10

## 2017-10-10 NOTE — ED Notes (Signed)
Assisted patient from the stretcher to the bed. Patient has a Biomedical engineer from the facility. Patient is alert but disoriented x 4. Appears in no acute distress.

## 2017-10-10 NOTE — ED Triage Notes (Signed)
Pt brought in by EMS from home, pt returned home from rehab yesterday. EMS reports patient with episode of vomiting yesterday, wife called EMS because patient was difficulty to wake this morning. EMS states patient awake when they arrived, BS 113, pt confused on arrival to ED.

## 2017-10-10 NOTE — ED Provider Notes (Addendum)
[facility] DEPT Provider Note   CSN: [id number] Arrival date & time: 10/10/17  1134     History   Chief Complaint Chief Complaint  Patient presents with  . Altered Mental Status    HPI Mitchell [name] is a 83 y.o. male.  HPI   83 year old frail elderly male with advanced dementia and past medical history significant for old compression fractures osteoarthritis spondylosis and subdural hematoma.  Patient brought in by family for increasingly altered mental status.  Patient had been discharged to nursing facility.  The family tried to bring patient home from nursing facility hoping it would help clear his head and make him more lucid.  However he is continued to deteriorate  in his mental status.  Patient has not been eating for several days to weeks.  Patient is  to take any fluids by mouth and continually stating "I just want to die".  Wife and caretaker at bedside.  They report worsening.  We discussed CODE STATUS, patient DNR/DNI.  Past Medical History:  Diagnosis Date  . Arthritis   . BPH (benign prostatic hyperplasia)   . Calcium nephrolithiasis   . Cataract   . Compression fracture of L2 lumbar vertebra (HCC)    and L4  . Inguinal hernia   . Osteoarthritis   . Spondylosis   . Subdural hematoma [facility])     Patient Active Problem List   Diagnosis Date Noted  . Sepsis ([place]) 08/01/2017  . UTI (urinary tract infection) 07/31/2017  . SDH (subdural hematoma) ([place]) 07/08/2017  . Pressure injury of skin 07/08/2017  . Subdural hematoma ([place]) 07/08/2017    History reviewed. No pertinent surgical history.     Home Medications    Prior to Admission medications   Medication Sig Start Date End Date Taking? Authorizing Provider  ALPRAZolam (XANAX) 0.25 MG tablet Take 1 tablet (0.25 mg total) by mouth at bedtime as needed for anxiety. Patient taking differently: Take 0.125 mg 2 (two) times daily by mouth. SOMETIMES WILL TAKE .125 MG IN THE  MIDDLE OF THE NIGHT IF AGITATED 08/02/17  Yes Georgette Shell, MD  aspirin 81 MG chewable tablet Chew 81 mg by mouth daily.   Yes [provider]  buPROPion (WELLBUTRIN XL) 150 MG 24 hr tablet Take 150 mg by mouth every morning. 06/29/17  Yes [provider]  Multiple Vitamin (MULTIVITAMIN WITH MINERALS) TABS tablet Take 1 tablet by mouth daily.   Yes [provider]  MYRBETRIQ 50 MG TB24 tablet Take 50 mg by mouth daily. 06/07/17  Yes [provider]  tamsulosin (FLOMAX) 0.4 MG CAPS capsule Take 0.4 mg by mouth daily. 06/07/17  Yes [provider]  calcium carbonate (OS-CAL - DOSED IN MG OF ELEMENTAL CALCIUM) 1250 (500 Ca) MG tablet Take 1 tablet by mouth.    [provider]  cephALEXin (KEFLEX) 500 MG capsule Take 1 capsule (500 mg total) by mouth 4 (four) times daily. Patient not taking: Reported on 10/10/2017 08/02/17   Georgette Shell, MD  Omega-3 Fatty Acids (FISH OIL) 1000 MG CAPS Take 1,000 mg by mouth daily.    [provider]    Family History History reviewed. No pertinent family history.  Social History Social History   Tobacco Use  . Smoking status: Current Every Day Smoker  . Smokeless tobacco: Never Used  Substance Use Topics  . Alcohol use: No  . Drug use: Not on file     Allergies   Moxifloxacin and  Quinine derivatives   Review of Systems Review of Systems  Unable to perform ROS: Dementia     Physical Exam Updated Vital Signs BP 108/68   Pulse 97   Temp 98.3 F (36.8 C) (Oral)   Resp 16   SpO2 97%   Physical Exam  Constitutional: He appears well-nourished.  Patient lying with eyes closed mouth open,  HENT:  Head: Normocephalic.  Mouth appears very dry.  Eyes: Conjunctivae are normal. Right eye exhibits no discharge. Left eye exhibits no discharge.  Cardiovascular:  Tachycardia.  Pulmonary/Chest: Breath sounds normal. No respiratory distress.  Mild tachypnea.  Abdominal: Soft. He  exhibits no distension. There is no tenderness.  Neurological:  Oriented x0.  Skin: Skin is warm and dry. He is not diaphoretic.     ED Treatments / Results  Labs (all labs ordered are listed, but only abnormal results are displayed) Labs Reviewed  COMPREHENSIVE METABOLIC PANEL - Abnormal; Notable for the following components:      Result Value   BUN 40 (*)    Creatinine, Ser 1.87 (*)    Albumin 3.3 (*)    GFR calc non Af Amer 30 (*)    GFR calc Af Amer 35 (*)    All other components within normal limits  CBC - Abnormal; Notable for the following components:   WBC 11.0 (*)    All other components within normal limits  URINALYSIS, ROUTINE W REFLEX MICROSCOPIC - Abnormal; Notable for the following components:   Hgb urine dipstick MODERATE (*)    Nitrite POSITIVE (*)    Leukocytes, UA LARGE (*)    Bacteria, UA RARE (*)    All other components within normal limits  DIFFERENTIAL - Abnormal; Notable for the following components:   Neutro Abs 8.0 (*)    Monocytes Absolute 1.3 (*)    All other components within normal limits  CBC WITH DIFFERENTIAL/PLATELET  CBG MONITORING, ED  I-STAT CG4 LACTIC ACID, ED  I-STAT TROPONIN, ED    EKG  EKG Interpretation None       Radiology Dg Chest 2 View  Result Date: 10/10/2017 CLINICAL DATA:  Chest pain. EXAM: CHEST  2 VIEW COMPARISON:  Radiographs of July 31, 2017. FINDINGS: Stable cardiomediastinal silhouette. Atherosclerosis of thoracic aorta is noted. Stable elevated left hemidiaphragm is noted. Lungs are clear. No pneumothorax or pleural effusion is noted. Stable position of right ventriculoperitoneal shunt. Bony thorax is unremarkable. IMPRESSION: Stable elevated left hemidiaphragm. Aortic atherosclerosis. No acute cardiopulmonary abnormality seen. Electronically Signed   By: Marijo Conception, M.D.   On: 10/10/2017 13:57   Ct Head Wo Contrast  Result Date: 10/10/2017 CLINICAL DATA:  Altered mental status EXAM: CT HEAD WITHOUT  CONTRAST TECHNIQUE: Contiguous axial images were obtained from the base of the skull through the vertex without intravenous contrast. COMPARISON:  Head CT 07/31/2017 FINDINGS: Brain: There is a right parietal approach shunt catheter with tip in the frontal horn of the left lateral ventricle. Compared to the examination of 07/31/2017, there is increased hydrocephalus, most notable in the left lateral ventricle and third ventricle. The frontal horn and temporal horn of the left lateral ventricle have increased in size. Previously seen right convexity subdural hematoma has resolved. There is a small amount of residual chronic subdural blood over the left convexity, measuring 3 mm in thickness. The degree of periventricular hypoattenuation is unchanged. Vascular: Atherosclerotic calcification of the internal carotid arteries at the skull base. Skull: Normal visualized skull base, calvarium and extracranial soft tissues.  Sinuses/Orbits: No sinus fluid levels or advanced mucosal thickening. No mastoid effusion. Normal orbits. IMPRESSION: 1. Increased hydrocephalus, most notable in the left lateral and third ventricles, is concerning for shunt failure. 2. No acute hemorrhage. Resolution of previously seen right subdural hematoma. Minimal chronic residual left subdural blood. Electronically Signed   By: Ulyses Jarred M.D.   On: 10/10/2017 14:11    Procedures Procedures (including critical care time)  Medications Ordered in ED Medications  cefTRIAXone (ROCEPHIN) 1 g in dextrose 5 % 50 mL IVPB (not administered)  sodium chloride 0.9 % bolus 1,000 mL (1,000 mLs Intravenous New Bag/Given 10/10/17 1243)  dextrose 50 % solution 25 g (25 g Intravenous Given 10/10/17 1259)     Initial Impression / Assessment and Plan / ED Course  I have reviewed the triage vital signs and the nursing notes.  Pertinent labs & imaging results that were available during my care of the patient were reviewed by me and considered in my  medical decision making (see chart for details).     Chronically ill appearing 83 year old male presenting with altered mental status.  We will do altered mental status workup.  Including CT scan given to history of subdural, urine, labs.  Suspect severe dehydration given physical exam and vital signs.  Patient's labs are reassuring.  Vital signs are reassuring.  However patient does have a urinary tract infection.  Patient CT also shows increasing hydrocephalus on exam with questionable shunt failure.  Patient had shunt placed at least 15 years ago for normal pressure hydrocephalus.  Does appear to have an interval increase in hydrocephalus from CT that was done 2 months ago during his last subdural after fall.   3:33 PM Had extensive conversation with family member and caregiver about goals of care.  They really still hope that he will get much better.  Will discuss with neurosurgery about whether they can manage this shunt or whether need to transfer to wait for status.  4:01 PM Discussed with our neurosurgeon Dr. Saintclair Halsted.  He remarks that if the patient has a [employer] implantable programmable shunt that he is able to reprogram it.  Otherwise not he is not able to.  In reviewing the notes from care everywhere it appears that patient has a [employer] programmable shunt. This was placed by Dr. Joycie Peek and is followed by Dr. Tivis Ringer.  Given this we will try to transfer patient to [facility] for program of shunt as well as potential admission for a urinary tract infection.  Gave ceftriaxone for UTI.   Dicsussed with Dr. Rigoberto Noel from neurosurgery and Dr. Lemmie Evens at [facility] ED.  Final Clinical Impressions(s) / ED Diagnoses   Final diagnoses:  NPH (normal pressure hydrocephalus)    ED Discharge Orders    None       Macarthur Critchley, MD 10/10/17 1615    Macarthur Critchley, MD 10/10/17 [phone]

## 2017-10-11 DIAGNOSIS — Z87442 Personal history of urinary calculi: Secondary | ICD-10-CM | POA: Diagnosis not present

## 2017-10-11 DIAGNOSIS — R4182 Altered mental status, unspecified: Secondary | ICD-10-CM | POA: Diagnosis not present

## 2017-10-11 DIAGNOSIS — R451 Restlessness and agitation: Secondary | ICD-10-CM | POA: Diagnosis not present

## 2017-10-11 DIAGNOSIS — R000 Tachycardia, unspecified: Secondary | ICD-10-CM | POA: Diagnosis not present

## 2017-10-11 DIAGNOSIS — R404 Transient alteration of awareness: Secondary | ICD-10-CM | POA: Diagnosis not present

## 2017-10-11 DIAGNOSIS — J690 Pneumonitis due to inhalation of food and vomit: Secondary | ICD-10-CM | POA: Diagnosis present

## 2017-10-11 DIAGNOSIS — Z982 Presence of cerebrospinal fluid drainage device: Secondary | ICD-10-CM | POA: Diagnosis not present

## 2017-10-11 DIAGNOSIS — R5381 Other malaise: Secondary | ICD-10-CM | POA: Diagnosis not present

## 2017-10-11 DIAGNOSIS — N179 Acute kidney failure, unspecified: Secondary | ICD-10-CM | POA: Diagnosis not present

## 2017-10-11 DIAGNOSIS — G8929 Other chronic pain: Secondary | ICD-10-CM | POA: Diagnosis not present

## 2017-10-11 DIAGNOSIS — G912 (Idiopathic) normal pressure hydrocephalus: Secondary | ICD-10-CM | POA: Diagnosis not present

## 2017-10-11 DIAGNOSIS — I82532 Chronic embolism and thrombosis of left popliteal vein: Secondary | ICD-10-CM | POA: Diagnosis not present

## 2017-10-11 DIAGNOSIS — Z5320 Procedure and treatment not carried out because of patient's decision for unspecified reasons: Secondary | ICD-10-CM | POA: Diagnosis not present

## 2017-10-11 DIAGNOSIS — Z7409 Other reduced mobility: Secondary | ICD-10-CM | POA: Diagnosis not present

## 2017-10-11 DIAGNOSIS — G919 Hydrocephalus, unspecified: Secondary | ICD-10-CM | POA: Diagnosis not present

## 2017-10-11 DIAGNOSIS — B9620 Unspecified Escherichia coli [E. coli] as the cause of diseases classified elsewhere: Secondary | ICD-10-CM | POA: Diagnosis not present

## 2017-10-11 DIAGNOSIS — Z79899 Other long term (current) drug therapy: Secondary | ICD-10-CM | POA: Diagnosis not present

## 2017-10-11 DIAGNOSIS — F329 Major depressive disorder, single episode, unspecified: Secondary | ICD-10-CM | POA: Diagnosis present

## 2017-10-11 DIAGNOSIS — F0390 Unspecified dementia without behavioral disturbance: Secondary | ICD-10-CM | POA: Diagnosis present

## 2017-10-11 DIAGNOSIS — N400 Enlarged prostate without lower urinary tract symptoms: Secondary | ICD-10-CM | POA: Diagnosis present

## 2017-10-11 DIAGNOSIS — N390 Urinary tract infection, site not specified: Secondary | ICD-10-CM | POA: Diagnosis not present

## 2017-10-11 DIAGNOSIS — R0989 Other specified symptoms and signs involving the circulatory and respiratory systems: Secondary | ICD-10-CM | POA: Diagnosis not present

## 2017-10-11 DIAGNOSIS — N3090 Cystitis, unspecified without hematuria: Secondary | ICD-10-CM | POA: Diagnosis present

## 2017-10-11 DIAGNOSIS — R918 Other nonspecific abnormal finding of lung field: Secondary | ICD-10-CM | POA: Diagnosis not present

## 2017-10-11 DIAGNOSIS — R413 Other amnesia: Secondary | ICD-10-CM | POA: Diagnosis not present

## 2017-10-11 DIAGNOSIS — R2689 Other abnormalities of gait and mobility: Secondary | ICD-10-CM | POA: Diagnosis not present

## 2017-10-11 DIAGNOSIS — G9341 Metabolic encephalopathy: Secondary | ICD-10-CM | POA: Diagnosis present

## 2017-10-11 NOTE — ED Notes (Signed)
Report called to [facility] ED, charge nurse, [place].

## 2017-10-11 NOTE — ED Provider Notes (Signed)
Notified this AM by RN that patient had not been  Transferred yesterday as planned.  No change in his medical status.  Will contact [facility]. Spoke with Dr. Curly Rim at [facility] ED.  Updated him on patient status.  Pt seen and examined this am at 8:50 AM .  Pt denies complaints, difficult to understand but he asked me to turn off the lights.  Pt is moving all extremities, alert but confused.  Appears medically stable.  Will arrange for transfer now.     Dorie Rank, MD 10/11/17 [phone]

## 2017-10-13 LAB — URINE CULTURE: Culture: >=100000 — AB

## 2017-10-14 ENCOUNTER — Telehealth: Payer: Self-pay

## 2017-10-14 NOTE — Telephone Encounter (Signed)
Pt with + UC given Ceftriaxone in ED 10/11/17 and transferred to [facility] per [facility] Pharm D

## 2017-10-23 DIAGNOSIS — M1990 Unspecified osteoarthritis, unspecified site: Secondary | ICD-10-CM | POA: Diagnosis not present

## 2017-10-23 DIAGNOSIS — G912 (Idiopathic) normal pressure hydrocephalus: Secondary | ICD-10-CM | POA: Diagnosis not present

## 2017-10-23 DIAGNOSIS — M5136 Other intervertebral disc degeneration, lumbar region: Secondary | ICD-10-CM | POA: Diagnosis not present

## 2017-10-23 DIAGNOSIS — I50813 Acute on chronic right heart failure: Secondary | ICD-10-CM | POA: Diagnosis not present

## 2017-10-23 DIAGNOSIS — M47816 Spondylosis without myelopathy or radiculopathy, lumbar region: Secondary | ICD-10-CM | POA: Diagnosis not present

## 2017-10-23 DIAGNOSIS — F0391 Unspecified dementia with behavioral disturbance: Secondary | ICD-10-CM | POA: Diagnosis not present

## 2017-10-25 DIAGNOSIS — M5136 Other intervertebral disc degeneration, lumbar region: Secondary | ICD-10-CM | POA: Diagnosis not present

## 2017-10-25 DIAGNOSIS — M47816 Spondylosis without myelopathy or radiculopathy, lumbar region: Secondary | ICD-10-CM | POA: Diagnosis not present

## 2017-10-25 DIAGNOSIS — M1990 Unspecified osteoarthritis, unspecified site: Secondary | ICD-10-CM | POA: Diagnosis not present

## 2017-10-25 DIAGNOSIS — F0391 Unspecified dementia with behavioral disturbance: Secondary | ICD-10-CM | POA: Diagnosis not present

## 2017-10-25 DIAGNOSIS — I50813 Acute on chronic right heart failure: Secondary | ICD-10-CM | POA: Diagnosis not present

## 2017-10-25 DIAGNOSIS — G912 (Idiopathic) normal pressure hydrocephalus: Secondary | ICD-10-CM | POA: Diagnosis not present

## 2017-10-26 DIAGNOSIS — M5136 Other intervertebral disc degeneration, lumbar region: Secondary | ICD-10-CM | POA: Diagnosis not present

## 2017-10-26 DIAGNOSIS — F0391 Unspecified dementia with behavioral disturbance: Secondary | ICD-10-CM | POA: Diagnosis not present

## 2017-10-26 DIAGNOSIS — M1990 Unspecified osteoarthritis, unspecified site: Secondary | ICD-10-CM | POA: Diagnosis not present

## 2017-10-26 DIAGNOSIS — G912 (Idiopathic) normal pressure hydrocephalus: Secondary | ICD-10-CM | POA: Diagnosis not present

## 2017-10-26 DIAGNOSIS — I50813 Acute on chronic right heart failure: Secondary | ICD-10-CM | POA: Diagnosis not present

## 2017-10-26 DIAGNOSIS — M47816 Spondylosis without myelopathy or radiculopathy, lumbar region: Secondary | ICD-10-CM | POA: Diagnosis not present

## 2017-10-29 DIAGNOSIS — F0391 Unspecified dementia with behavioral disturbance: Secondary | ICD-10-CM | POA: Diagnosis not present

## 2017-10-29 DIAGNOSIS — M47816 Spondylosis without myelopathy or radiculopathy, lumbar region: Secondary | ICD-10-CM | POA: Diagnosis not present

## 2017-10-29 DIAGNOSIS — M5136 Other intervertebral disc degeneration, lumbar region: Secondary | ICD-10-CM | POA: Diagnosis not present

## 2017-10-29 DIAGNOSIS — I50813 Acute on chronic right heart failure: Secondary | ICD-10-CM | POA: Diagnosis not present

## 2017-10-29 DIAGNOSIS — M1990 Unspecified osteoarthritis, unspecified site: Secondary | ICD-10-CM | POA: Diagnosis not present

## 2017-10-29 DIAGNOSIS — G912 (Idiopathic) normal pressure hydrocephalus: Secondary | ICD-10-CM | POA: Diagnosis not present

## 2017-10-30 DIAGNOSIS — G912 (Idiopathic) normal pressure hydrocephalus: Secondary | ICD-10-CM | POA: Diagnosis not present

## 2017-10-30 DIAGNOSIS — I50813 Acute on chronic right heart failure: Secondary | ICD-10-CM | POA: Diagnosis not present

## 2017-10-30 DIAGNOSIS — M5136 Other intervertebral disc degeneration, lumbar region: Secondary | ICD-10-CM | POA: Diagnosis not present

## 2017-10-30 DIAGNOSIS — M1990 Unspecified osteoarthritis, unspecified site: Secondary | ICD-10-CM | POA: Diagnosis not present

## 2017-10-30 DIAGNOSIS — F0391 Unspecified dementia with behavioral disturbance: Secondary | ICD-10-CM | POA: Diagnosis not present

## 2017-10-30 DIAGNOSIS — M47816 Spondylosis without myelopathy or radiculopathy, lumbar region: Secondary | ICD-10-CM | POA: Diagnosis not present

## 2017-10-31 DIAGNOSIS — M1990 Unspecified osteoarthritis, unspecified site: Secondary | ICD-10-CM | POA: Diagnosis not present

## 2017-10-31 DIAGNOSIS — I50813 Acute on chronic right heart failure: Secondary | ICD-10-CM | POA: Diagnosis not present

## 2017-10-31 DIAGNOSIS — G912 (Idiopathic) normal pressure hydrocephalus: Secondary | ICD-10-CM | POA: Diagnosis not present

## 2017-10-31 DIAGNOSIS — F0391 Unspecified dementia with behavioral disturbance: Secondary | ICD-10-CM | POA: Diagnosis not present

## 2017-10-31 DIAGNOSIS — M47816 Spondylosis without myelopathy or radiculopathy, lumbar region: Secondary | ICD-10-CM | POA: Diagnosis not present

## 2017-10-31 DIAGNOSIS — M5136 Other intervertebral disc degeneration, lumbar region: Secondary | ICD-10-CM | POA: Diagnosis not present

## 2017-11-01 DIAGNOSIS — M47816 Spondylosis without myelopathy or radiculopathy, lumbar region: Secondary | ICD-10-CM | POA: Diagnosis not present

## 2017-11-01 DIAGNOSIS — G912 (Idiopathic) normal pressure hydrocephalus: Secondary | ICD-10-CM | POA: Diagnosis not present

## 2017-11-01 DIAGNOSIS — I50813 Acute on chronic right heart failure: Secondary | ICD-10-CM | POA: Diagnosis not present

## 2017-11-01 DIAGNOSIS — F0391 Unspecified dementia with behavioral disturbance: Secondary | ICD-10-CM | POA: Diagnosis not present

## 2017-11-01 DIAGNOSIS — M5136 Other intervertebral disc degeneration, lumbar region: Secondary | ICD-10-CM | POA: Diagnosis not present

## 2017-11-01 DIAGNOSIS — M1990 Unspecified osteoarthritis, unspecified site: Secondary | ICD-10-CM | POA: Diagnosis not present

## 2017-11-05 DIAGNOSIS — M5136 Other intervertebral disc degeneration, lumbar region: Secondary | ICD-10-CM | POA: Diagnosis not present

## 2017-11-05 DIAGNOSIS — F0391 Unspecified dementia with behavioral disturbance: Secondary | ICD-10-CM | POA: Diagnosis not present

## 2017-11-05 DIAGNOSIS — M47816 Spondylosis without myelopathy or radiculopathy, lumbar region: Secondary | ICD-10-CM | POA: Diagnosis not present

## 2017-11-05 DIAGNOSIS — G912 (Idiopathic) normal pressure hydrocephalus: Secondary | ICD-10-CM | POA: Diagnosis not present

## 2017-11-05 DIAGNOSIS — M1990 Unspecified osteoarthritis, unspecified site: Secondary | ICD-10-CM | POA: Diagnosis not present

## 2017-11-05 DIAGNOSIS — I50813 Acute on chronic right heart failure: Secondary | ICD-10-CM | POA: Diagnosis not present

## 2017-11-07 DIAGNOSIS — M47816 Spondylosis without myelopathy or radiculopathy, lumbar region: Secondary | ICD-10-CM | POA: Diagnosis not present

## 2017-11-07 DIAGNOSIS — G912 (Idiopathic) normal pressure hydrocephalus: Secondary | ICD-10-CM | POA: Diagnosis not present

## 2017-11-07 DIAGNOSIS — F0391 Unspecified dementia with behavioral disturbance: Secondary | ICD-10-CM | POA: Diagnosis not present

## 2017-11-07 DIAGNOSIS — I50813 Acute on chronic right heart failure: Secondary | ICD-10-CM | POA: Diagnosis not present

## 2017-11-07 DIAGNOSIS — M5136 Other intervertebral disc degeneration, lumbar region: Secondary | ICD-10-CM | POA: Diagnosis not present

## 2017-11-07 DIAGNOSIS — M1990 Unspecified osteoarthritis, unspecified site: Secondary | ICD-10-CM | POA: Diagnosis not present

## 2017-11-09 DIAGNOSIS — M5136 Other intervertebral disc degeneration, lumbar region: Secondary | ICD-10-CM | POA: Diagnosis not present

## 2017-11-09 DIAGNOSIS — M1990 Unspecified osteoarthritis, unspecified site: Secondary | ICD-10-CM | POA: Diagnosis not present

## 2017-11-09 DIAGNOSIS — I50813 Acute on chronic right heart failure: Secondary | ICD-10-CM | POA: Diagnosis not present

## 2017-11-09 DIAGNOSIS — M47816 Spondylosis without myelopathy or radiculopathy, lumbar region: Secondary | ICD-10-CM | POA: Diagnosis not present

## 2017-11-09 DIAGNOSIS — F0391 Unspecified dementia with behavioral disturbance: Secondary | ICD-10-CM | POA: Diagnosis not present

## 2017-11-09 DIAGNOSIS — G912 (Idiopathic) normal pressure hydrocephalus: Secondary | ICD-10-CM | POA: Diagnosis not present

## 2017-11-12 DIAGNOSIS — M1990 Unspecified osteoarthritis, unspecified site: Secondary | ICD-10-CM | POA: Diagnosis not present

## 2017-11-12 DIAGNOSIS — I50813 Acute on chronic right heart failure: Secondary | ICD-10-CM | POA: Diagnosis not present

## 2017-11-12 DIAGNOSIS — G912 (Idiopathic) normal pressure hydrocephalus: Secondary | ICD-10-CM | POA: Diagnosis not present

## 2017-11-12 DIAGNOSIS — F0391 Unspecified dementia with behavioral disturbance: Secondary | ICD-10-CM | POA: Diagnosis not present

## 2017-11-12 DIAGNOSIS — M5136 Other intervertebral disc degeneration, lumbar region: Secondary | ICD-10-CM | POA: Diagnosis not present

## 2017-11-12 DIAGNOSIS — M47816 Spondylosis without myelopathy or radiculopathy, lumbar region: Secondary | ICD-10-CM | POA: Diagnosis not present

## 2017-11-14 DIAGNOSIS — M1990 Unspecified osteoarthritis, unspecified site: Secondary | ICD-10-CM | POA: Diagnosis not present

## 2017-11-14 DIAGNOSIS — M5136 Other intervertebral disc degeneration, lumbar region: Secondary | ICD-10-CM | POA: Diagnosis not present

## 2017-11-14 DIAGNOSIS — I50813 Acute on chronic right heart failure: Secondary | ICD-10-CM | POA: Diagnosis not present

## 2017-11-14 DIAGNOSIS — F0391 Unspecified dementia with behavioral disturbance: Secondary | ICD-10-CM | POA: Diagnosis not present

## 2017-11-14 DIAGNOSIS — G912 (Idiopathic) normal pressure hydrocephalus: Secondary | ICD-10-CM | POA: Diagnosis not present

## 2017-11-14 DIAGNOSIS — M47816 Spondylosis without myelopathy or radiculopathy, lumbar region: Secondary | ICD-10-CM | POA: Diagnosis not present

## 2017-11-16 DIAGNOSIS — M1990 Unspecified osteoarthritis, unspecified site: Secondary | ICD-10-CM | POA: Diagnosis not present

## 2017-11-16 DIAGNOSIS — G912 (Idiopathic) normal pressure hydrocephalus: Secondary | ICD-10-CM | POA: Diagnosis not present

## 2017-11-16 DIAGNOSIS — F0391 Unspecified dementia with behavioral disturbance: Secondary | ICD-10-CM | POA: Diagnosis not present

## 2017-11-16 DIAGNOSIS — M47816 Spondylosis without myelopathy or radiculopathy, lumbar region: Secondary | ICD-10-CM | POA: Diagnosis not present

## 2017-11-16 DIAGNOSIS — M5136 Other intervertebral disc degeneration, lumbar region: Secondary | ICD-10-CM | POA: Diagnosis not present

## 2017-11-16 DIAGNOSIS — I50813 Acute on chronic right heart failure: Secondary | ICD-10-CM | POA: Diagnosis not present

## 2017-11-19 DIAGNOSIS — M47816 Spondylosis without myelopathy or radiculopathy, lumbar region: Secondary | ICD-10-CM | POA: Diagnosis not present

## 2017-11-19 DIAGNOSIS — G912 (Idiopathic) normal pressure hydrocephalus: Secondary | ICD-10-CM | POA: Diagnosis not present

## 2017-11-19 DIAGNOSIS — M5136 Other intervertebral disc degeneration, lumbar region: Secondary | ICD-10-CM | POA: Diagnosis not present

## 2017-11-19 DIAGNOSIS — F0391 Unspecified dementia with behavioral disturbance: Secondary | ICD-10-CM | POA: Diagnosis not present

## 2017-11-19 DIAGNOSIS — I50813 Acute on chronic right heart failure: Secondary | ICD-10-CM | POA: Diagnosis not present

## 2017-11-19 DIAGNOSIS — M1990 Unspecified osteoarthritis, unspecified site: Secondary | ICD-10-CM | POA: Diagnosis not present

## 2017-11-21 DIAGNOSIS — M1990 Unspecified osteoarthritis, unspecified site: Secondary | ICD-10-CM | POA: Diagnosis not present

## 2017-11-21 DIAGNOSIS — M5136 Other intervertebral disc degeneration, lumbar region: Secondary | ICD-10-CM | POA: Diagnosis not present

## 2017-11-21 DIAGNOSIS — I50813 Acute on chronic right heart failure: Secondary | ICD-10-CM | POA: Diagnosis not present

## 2017-11-21 DIAGNOSIS — G912 (Idiopathic) normal pressure hydrocephalus: Secondary | ICD-10-CM | POA: Diagnosis not present

## 2017-11-21 DIAGNOSIS — F0391 Unspecified dementia with behavioral disturbance: Secondary | ICD-10-CM | POA: Diagnosis not present

## 2017-11-21 DIAGNOSIS — M47816 Spondylosis without myelopathy or radiculopathy, lumbar region: Secondary | ICD-10-CM | POA: Diagnosis not present

## 2017-11-26 DIAGNOSIS — M47816 Spondylosis without myelopathy or radiculopathy, lumbar region: Secondary | ICD-10-CM | POA: Diagnosis not present

## 2017-11-26 DIAGNOSIS — I50813 Acute on chronic right heart failure: Secondary | ICD-10-CM | POA: Diagnosis not present

## 2017-11-26 DIAGNOSIS — G912 (Idiopathic) normal pressure hydrocephalus: Secondary | ICD-10-CM | POA: Diagnosis not present

## 2017-11-26 DIAGNOSIS — F0391 Unspecified dementia with behavioral disturbance: Secondary | ICD-10-CM | POA: Diagnosis not present

## 2017-11-26 DIAGNOSIS — M5136 Other intervertebral disc degeneration, lumbar region: Secondary | ICD-10-CM | POA: Diagnosis not present

## 2017-11-26 DIAGNOSIS — M1990 Unspecified osteoarthritis, unspecified site: Secondary | ICD-10-CM | POA: Diagnosis not present

## 2017-11-28 DIAGNOSIS — F0391 Unspecified dementia with behavioral disturbance: Secondary | ICD-10-CM | POA: Diagnosis not present

## 2017-11-28 DIAGNOSIS — G912 (Idiopathic) normal pressure hydrocephalus: Secondary | ICD-10-CM | POA: Diagnosis not present

## 2017-11-28 DIAGNOSIS — M5136 Other intervertebral disc degeneration, lumbar region: Secondary | ICD-10-CM | POA: Diagnosis not present

## 2017-11-28 DIAGNOSIS — I50813 Acute on chronic right heart failure: Secondary | ICD-10-CM | POA: Diagnosis not present

## 2017-11-28 DIAGNOSIS — M1990 Unspecified osteoarthritis, unspecified site: Secondary | ICD-10-CM | POA: Diagnosis not present

## 2017-11-28 DIAGNOSIS — M47816 Spondylosis without myelopathy or radiculopathy, lumbar region: Secondary | ICD-10-CM | POA: Diagnosis not present

## 2017-12-03 DIAGNOSIS — M47816 Spondylosis without myelopathy or radiculopathy, lumbar region: Secondary | ICD-10-CM | POA: Diagnosis not present

## 2017-12-03 DIAGNOSIS — G912 (Idiopathic) normal pressure hydrocephalus: Secondary | ICD-10-CM | POA: Diagnosis not present

## 2017-12-03 DIAGNOSIS — M1990 Unspecified osteoarthritis, unspecified site: Secondary | ICD-10-CM | POA: Diagnosis not present

## 2017-12-03 DIAGNOSIS — M5136 Other intervertebral disc degeneration, lumbar region: Secondary | ICD-10-CM | POA: Diagnosis not present

## 2017-12-03 DIAGNOSIS — F0391 Unspecified dementia with behavioral disturbance: Secondary | ICD-10-CM | POA: Diagnosis not present

## 2017-12-03 DIAGNOSIS — I50813 Acute on chronic right heart failure: Secondary | ICD-10-CM | POA: Diagnosis not present

## 2017-12-05 DIAGNOSIS — I50813 Acute on chronic right heart failure: Secondary | ICD-10-CM | POA: Diagnosis not present

## 2017-12-05 DIAGNOSIS — M5136 Other intervertebral disc degeneration, lumbar region: Secondary | ICD-10-CM | POA: Diagnosis not present

## 2017-12-05 DIAGNOSIS — G912 (Idiopathic) normal pressure hydrocephalus: Secondary | ICD-10-CM | POA: Diagnosis not present

## 2017-12-05 DIAGNOSIS — M47816 Spondylosis without myelopathy or radiculopathy, lumbar region: Secondary | ICD-10-CM | POA: Diagnosis not present

## 2017-12-05 DIAGNOSIS — F0391 Unspecified dementia with behavioral disturbance: Secondary | ICD-10-CM | POA: Diagnosis not present

## 2017-12-05 DIAGNOSIS — M1990 Unspecified osteoarthritis, unspecified site: Secondary | ICD-10-CM | POA: Diagnosis not present

## 2017-12-10 DIAGNOSIS — F0391 Unspecified dementia with behavioral disturbance: Secondary | ICD-10-CM | POA: Diagnosis not present

## 2017-12-10 DIAGNOSIS — G912 (Idiopathic) normal pressure hydrocephalus: Secondary | ICD-10-CM | POA: Diagnosis not present

## 2017-12-10 DIAGNOSIS — M47816 Spondylosis without myelopathy or radiculopathy, lumbar region: Secondary | ICD-10-CM | POA: Diagnosis not present

## 2017-12-10 DIAGNOSIS — M5136 Other intervertebral disc degeneration, lumbar region: Secondary | ICD-10-CM | POA: Diagnosis not present

## 2017-12-10 DIAGNOSIS — I50813 Acute on chronic right heart failure: Secondary | ICD-10-CM | POA: Diagnosis not present

## 2017-12-10 DIAGNOSIS — M1990 Unspecified osteoarthritis, unspecified site: Secondary | ICD-10-CM | POA: Diagnosis not present

## 2017-12-12 DIAGNOSIS — M47816 Spondylosis without myelopathy or radiculopathy, lumbar region: Secondary | ICD-10-CM | POA: Diagnosis not present

## 2017-12-12 DIAGNOSIS — I50813 Acute on chronic right heart failure: Secondary | ICD-10-CM | POA: Diagnosis not present

## 2017-12-12 DIAGNOSIS — G912 (Idiopathic) normal pressure hydrocephalus: Secondary | ICD-10-CM | POA: Diagnosis not present

## 2017-12-12 DIAGNOSIS — F0391 Unspecified dementia with behavioral disturbance: Secondary | ICD-10-CM | POA: Diagnosis not present

## 2017-12-12 DIAGNOSIS — M5136 Other intervertebral disc degeneration, lumbar region: Secondary | ICD-10-CM | POA: Diagnosis not present

## 2017-12-12 DIAGNOSIS — M1990 Unspecified osteoarthritis, unspecified site: Secondary | ICD-10-CM | POA: Diagnosis not present

## 2017-12-18 DIAGNOSIS — G912 (Idiopathic) normal pressure hydrocephalus: Secondary | ICD-10-CM | POA: Diagnosis not present

## 2017-12-18 DIAGNOSIS — I50813 Acute on chronic right heart failure: Secondary | ICD-10-CM | POA: Diagnosis not present

## 2017-12-18 DIAGNOSIS — M5136 Other intervertebral disc degeneration, lumbar region: Secondary | ICD-10-CM | POA: Diagnosis not present

## 2017-12-18 DIAGNOSIS — M1990 Unspecified osteoarthritis, unspecified site: Secondary | ICD-10-CM | POA: Diagnosis not present

## 2017-12-18 DIAGNOSIS — M47816 Spondylosis without myelopathy or radiculopathy, lumbar region: Secondary | ICD-10-CM | POA: Diagnosis not present

## 2017-12-18 DIAGNOSIS — F0391 Unspecified dementia with behavioral disturbance: Secondary | ICD-10-CM | POA: Diagnosis not present

## 2017-12-19 DIAGNOSIS — I50813 Acute on chronic right heart failure: Secondary | ICD-10-CM | POA: Diagnosis not present

## 2017-12-19 DIAGNOSIS — M1990 Unspecified osteoarthritis, unspecified site: Secondary | ICD-10-CM | POA: Diagnosis not present

## 2017-12-19 DIAGNOSIS — G912 (Idiopathic) normal pressure hydrocephalus: Secondary | ICD-10-CM | POA: Diagnosis not present

## 2017-12-19 DIAGNOSIS — M47816 Spondylosis without myelopathy or radiculopathy, lumbar region: Secondary | ICD-10-CM | POA: Diagnosis not present

## 2017-12-19 DIAGNOSIS — F0391 Unspecified dementia with behavioral disturbance: Secondary | ICD-10-CM | POA: Diagnosis not present

## 2017-12-19 DIAGNOSIS — M5136 Other intervertebral disc degeneration, lumbar region: Secondary | ICD-10-CM | POA: Diagnosis not present

## 2017-12-22 DIAGNOSIS — F3289 Other specified depressive episodes: Secondary | ICD-10-CM | POA: Diagnosis not present

## 2017-12-22 DIAGNOSIS — M5136 Other intervertebral disc degeneration, lumbar region: Secondary | ICD-10-CM | POA: Diagnosis not present

## 2017-12-22 DIAGNOSIS — F418 Other specified anxiety disorders: Secondary | ICD-10-CM | POA: Diagnosis not present

## 2017-12-22 DIAGNOSIS — Z9181 History of falling: Secondary | ICD-10-CM | POA: Diagnosis not present

## 2017-12-22 DIAGNOSIS — I50813 Acute on chronic right heart failure: Secondary | ICD-10-CM | POA: Diagnosis not present

## 2017-12-22 DIAGNOSIS — M47816 Spondylosis without myelopathy or radiculopathy, lumbar region: Secondary | ICD-10-CM | POA: Diagnosis not present

## 2017-12-22 DIAGNOSIS — Z87440 Personal history of urinary (tract) infections: Secondary | ICD-10-CM | POA: Diagnosis not present

## 2017-12-22 DIAGNOSIS — G912 (Idiopathic) normal pressure hydrocephalus: Secondary | ICD-10-CM | POA: Diagnosis not present

## 2017-12-22 DIAGNOSIS — F0391 Unspecified dementia with behavioral disturbance: Secondary | ICD-10-CM | POA: Diagnosis not present

## 2017-12-22 DIAGNOSIS — Z982 Presence of cerebrospinal fluid drainage device: Secondary | ICD-10-CM | POA: Diagnosis not present

## 2017-12-22 DIAGNOSIS — F17210 Nicotine dependence, cigarettes, uncomplicated: Secondary | ICD-10-CM | POA: Diagnosis not present

## 2017-12-22 DIAGNOSIS — M1990 Unspecified osteoarthritis, unspecified site: Secondary | ICD-10-CM | POA: Diagnosis not present

## 2017-12-24 DIAGNOSIS — M1990 Unspecified osteoarthritis, unspecified site: Secondary | ICD-10-CM | POA: Diagnosis not present

## 2017-12-24 DIAGNOSIS — M47816 Spondylosis without myelopathy or radiculopathy, lumbar region: Secondary | ICD-10-CM | POA: Diagnosis not present

## 2017-12-24 DIAGNOSIS — M5136 Other intervertebral disc degeneration, lumbar region: Secondary | ICD-10-CM | POA: Diagnosis not present

## 2017-12-24 DIAGNOSIS — G912 (Idiopathic) normal pressure hydrocephalus: Secondary | ICD-10-CM | POA: Diagnosis not present

## 2017-12-24 DIAGNOSIS — I50813 Acute on chronic right heart failure: Secondary | ICD-10-CM | POA: Diagnosis not present

## 2017-12-24 DIAGNOSIS — F0391 Unspecified dementia with behavioral disturbance: Secondary | ICD-10-CM | POA: Diagnosis not present

## 2017-12-26 DIAGNOSIS — F0391 Unspecified dementia with behavioral disturbance: Secondary | ICD-10-CM | POA: Diagnosis not present

## 2017-12-26 DIAGNOSIS — I50813 Acute on chronic right heart failure: Secondary | ICD-10-CM | POA: Diagnosis not present

## 2017-12-26 DIAGNOSIS — G912 (Idiopathic) normal pressure hydrocephalus: Secondary | ICD-10-CM | POA: Diagnosis not present

## 2017-12-26 DIAGNOSIS — M5136 Other intervertebral disc degeneration, lumbar region: Secondary | ICD-10-CM | POA: Diagnosis not present

## 2017-12-26 DIAGNOSIS — M47816 Spondylosis without myelopathy or radiculopathy, lumbar region: Secondary | ICD-10-CM | POA: Diagnosis not present

## 2017-12-26 DIAGNOSIS — M1990 Unspecified osteoarthritis, unspecified site: Secondary | ICD-10-CM | POA: Diagnosis not present

## 2017-12-28 DIAGNOSIS — G912 (Idiopathic) normal pressure hydrocephalus: Secondary | ICD-10-CM | POA: Diagnosis not present

## 2017-12-28 DIAGNOSIS — M5136 Other intervertebral disc degeneration, lumbar region: Secondary | ICD-10-CM | POA: Diagnosis not present

## 2017-12-28 DIAGNOSIS — M1990 Unspecified osteoarthritis, unspecified site: Secondary | ICD-10-CM | POA: Diagnosis not present

## 2017-12-28 DIAGNOSIS — I50813 Acute on chronic right heart failure: Secondary | ICD-10-CM | POA: Diagnosis not present

## 2017-12-28 DIAGNOSIS — M47816 Spondylosis without myelopathy or radiculopathy, lumbar region: Secondary | ICD-10-CM | POA: Diagnosis not present

## 2017-12-28 DIAGNOSIS — F0391 Unspecified dementia with behavioral disturbance: Secondary | ICD-10-CM | POA: Diagnosis not present

## 2017-12-31 DIAGNOSIS — M47816 Spondylosis without myelopathy or radiculopathy, lumbar region: Secondary | ICD-10-CM | POA: Diagnosis not present

## 2017-12-31 DIAGNOSIS — M1990 Unspecified osteoarthritis, unspecified site: Secondary | ICD-10-CM | POA: Diagnosis not present

## 2017-12-31 DIAGNOSIS — G912 (Idiopathic) normal pressure hydrocephalus: Secondary | ICD-10-CM | POA: Diagnosis not present

## 2017-12-31 DIAGNOSIS — M5136 Other intervertebral disc degeneration, lumbar region: Secondary | ICD-10-CM | POA: Diagnosis not present

## 2017-12-31 DIAGNOSIS — F0391 Unspecified dementia with behavioral disturbance: Secondary | ICD-10-CM | POA: Diagnosis not present

## 2017-12-31 DIAGNOSIS — I50813 Acute on chronic right heart failure: Secondary | ICD-10-CM | POA: Diagnosis not present

## 2018-01-02 DIAGNOSIS — M47816 Spondylosis without myelopathy or radiculopathy, lumbar region: Secondary | ICD-10-CM | POA: Diagnosis not present

## 2018-01-02 DIAGNOSIS — I50813 Acute on chronic right heart failure: Secondary | ICD-10-CM | POA: Diagnosis not present

## 2018-01-02 DIAGNOSIS — F0391 Unspecified dementia with behavioral disturbance: Secondary | ICD-10-CM | POA: Diagnosis not present

## 2018-01-02 DIAGNOSIS — M1990 Unspecified osteoarthritis, unspecified site: Secondary | ICD-10-CM | POA: Diagnosis not present

## 2018-01-02 DIAGNOSIS — G912 (Idiopathic) normal pressure hydrocephalus: Secondary | ICD-10-CM | POA: Diagnosis not present

## 2018-01-02 DIAGNOSIS — M5136 Other intervertebral disc degeneration, lumbar region: Secondary | ICD-10-CM | POA: Diagnosis not present

## 2018-01-04 DIAGNOSIS — M1990 Unspecified osteoarthritis, unspecified site: Secondary | ICD-10-CM | POA: Diagnosis not present

## 2018-01-04 DIAGNOSIS — F0391 Unspecified dementia with behavioral disturbance: Secondary | ICD-10-CM | POA: Diagnosis not present

## 2018-01-04 DIAGNOSIS — G912 (Idiopathic) normal pressure hydrocephalus: Secondary | ICD-10-CM | POA: Diagnosis not present

## 2018-01-04 DIAGNOSIS — I50813 Acute on chronic right heart failure: Secondary | ICD-10-CM | POA: Diagnosis not present

## 2018-01-04 DIAGNOSIS — M5136 Other intervertebral disc degeneration, lumbar region: Secondary | ICD-10-CM | POA: Diagnosis not present

## 2018-01-04 DIAGNOSIS — M47816 Spondylosis without myelopathy or radiculopathy, lumbar region: Secondary | ICD-10-CM | POA: Diagnosis not present

## 2018-01-07 DIAGNOSIS — G912 (Idiopathic) normal pressure hydrocephalus: Secondary | ICD-10-CM | POA: Diagnosis not present

## 2018-01-07 DIAGNOSIS — M5136 Other intervertebral disc degeneration, lumbar region: Secondary | ICD-10-CM | POA: Diagnosis not present

## 2018-01-07 DIAGNOSIS — M1990 Unspecified osteoarthritis, unspecified site: Secondary | ICD-10-CM | POA: Diagnosis not present

## 2018-01-07 DIAGNOSIS — I50813 Acute on chronic right heart failure: Secondary | ICD-10-CM | POA: Diagnosis not present

## 2018-01-07 DIAGNOSIS — F0391 Unspecified dementia with behavioral disturbance: Secondary | ICD-10-CM | POA: Diagnosis not present

## 2018-01-07 DIAGNOSIS — M47816 Spondylosis without myelopathy or radiculopathy, lumbar region: Secondary | ICD-10-CM | POA: Diagnosis not present

## 2018-01-09 DIAGNOSIS — M5136 Other intervertebral disc degeneration, lumbar region: Secondary | ICD-10-CM | POA: Diagnosis not present

## 2018-01-09 DIAGNOSIS — F0391 Unspecified dementia with behavioral disturbance: Secondary | ICD-10-CM | POA: Diagnosis not present

## 2018-01-09 DIAGNOSIS — M1990 Unspecified osteoarthritis, unspecified site: Secondary | ICD-10-CM | POA: Diagnosis not present

## 2018-01-09 DIAGNOSIS — G912 (Idiopathic) normal pressure hydrocephalus: Secondary | ICD-10-CM | POA: Diagnosis not present

## 2018-01-09 DIAGNOSIS — M47816 Spondylosis without myelopathy or radiculopathy, lumbar region: Secondary | ICD-10-CM | POA: Diagnosis not present

## 2018-01-09 DIAGNOSIS — I50813 Acute on chronic right heart failure: Secondary | ICD-10-CM | POA: Diagnosis not present

## 2018-01-11 DIAGNOSIS — M5136 Other intervertebral disc degeneration, lumbar region: Secondary | ICD-10-CM | POA: Diagnosis not present

## 2018-01-11 DIAGNOSIS — M1990 Unspecified osteoarthritis, unspecified site: Secondary | ICD-10-CM | POA: Diagnosis not present

## 2018-01-11 DIAGNOSIS — I50813 Acute on chronic right heart failure: Secondary | ICD-10-CM | POA: Diagnosis not present

## 2018-01-11 DIAGNOSIS — M47816 Spondylosis without myelopathy or radiculopathy, lumbar region: Secondary | ICD-10-CM | POA: Diagnosis not present

## 2018-01-11 DIAGNOSIS — F0391 Unspecified dementia with behavioral disturbance: Secondary | ICD-10-CM | POA: Diagnosis not present

## 2018-01-11 DIAGNOSIS — G912 (Idiopathic) normal pressure hydrocephalus: Secondary | ICD-10-CM | POA: Diagnosis not present

## 2018-01-14 DIAGNOSIS — I50813 Acute on chronic right heart failure: Secondary | ICD-10-CM | POA: Diagnosis not present

## 2018-01-14 DIAGNOSIS — M1990 Unspecified osteoarthritis, unspecified site: Secondary | ICD-10-CM | POA: Diagnosis not present

## 2018-01-14 DIAGNOSIS — G912 (Idiopathic) normal pressure hydrocephalus: Secondary | ICD-10-CM | POA: Diagnosis not present

## 2018-01-14 DIAGNOSIS — M5136 Other intervertebral disc degeneration, lumbar region: Secondary | ICD-10-CM | POA: Diagnosis not present

## 2018-01-14 DIAGNOSIS — M47816 Spondylosis without myelopathy or radiculopathy, lumbar region: Secondary | ICD-10-CM | POA: Diagnosis not present

## 2018-01-14 DIAGNOSIS — F0391 Unspecified dementia with behavioral disturbance: Secondary | ICD-10-CM | POA: Diagnosis not present

## 2018-01-16 DIAGNOSIS — G912 (Idiopathic) normal pressure hydrocephalus: Secondary | ICD-10-CM | POA: Diagnosis not present

## 2018-01-16 DIAGNOSIS — M1990 Unspecified osteoarthritis, unspecified site: Secondary | ICD-10-CM | POA: Diagnosis not present

## 2018-01-16 DIAGNOSIS — M47816 Spondylosis without myelopathy or radiculopathy, lumbar region: Secondary | ICD-10-CM | POA: Diagnosis not present

## 2018-01-16 DIAGNOSIS — F0391 Unspecified dementia with behavioral disturbance: Secondary | ICD-10-CM | POA: Diagnosis not present

## 2018-01-16 DIAGNOSIS — I50813 Acute on chronic right heart failure: Secondary | ICD-10-CM | POA: Diagnosis not present

## 2018-01-16 DIAGNOSIS — M5136 Other intervertebral disc degeneration, lumbar region: Secondary | ICD-10-CM | POA: Diagnosis not present

## 2018-01-21 DIAGNOSIS — M5136 Other intervertebral disc degeneration, lumbar region: Secondary | ICD-10-CM | POA: Diagnosis not present

## 2018-01-21 DIAGNOSIS — M1990 Unspecified osteoarthritis, unspecified site: Secondary | ICD-10-CM | POA: Diagnosis not present

## 2018-01-21 DIAGNOSIS — G912 (Idiopathic) normal pressure hydrocephalus: Secondary | ICD-10-CM | POA: Diagnosis not present

## 2018-01-21 DIAGNOSIS — I50813 Acute on chronic right heart failure: Secondary | ICD-10-CM | POA: Diagnosis not present

## 2018-01-21 DIAGNOSIS — F0391 Unspecified dementia with behavioral disturbance: Secondary | ICD-10-CM | POA: Diagnosis not present

## 2018-01-21 DIAGNOSIS — M47816 Spondylosis without myelopathy or radiculopathy, lumbar region: Secondary | ICD-10-CM | POA: Diagnosis not present

## 2018-01-23 DIAGNOSIS — M1990 Unspecified osteoarthritis, unspecified site: Secondary | ICD-10-CM | POA: Diagnosis not present

## 2018-01-23 DIAGNOSIS — I50813 Acute on chronic right heart failure: Secondary | ICD-10-CM | POA: Diagnosis not present

## 2018-01-23 DIAGNOSIS — M47816 Spondylosis without myelopathy or radiculopathy, lumbar region: Secondary | ICD-10-CM | POA: Diagnosis not present

## 2018-01-23 DIAGNOSIS — M5136 Other intervertebral disc degeneration, lumbar region: Secondary | ICD-10-CM | POA: Diagnosis not present

## 2018-01-23 DIAGNOSIS — F0391 Unspecified dementia with behavioral disturbance: Secondary | ICD-10-CM | POA: Diagnosis not present

## 2018-01-23 DIAGNOSIS — G912 (Idiopathic) normal pressure hydrocephalus: Secondary | ICD-10-CM | POA: Diagnosis not present

## 2018-01-25 IMAGING — CT CT HEAD W/O CM
3 of 4 series · 14 of 47 positions shown, 16 images · non-contrast
Comparison: Head CT 07/31/2017

CLINICAL DATA: Altered mental status

EXAM:
CT HEAD WITHOUT CONTRAST
TECHNIQUE: Contiguous axial images were obtained from the base of the skull
through the vertex without intravenous contrast.

[Series 2: head w/o · axial · non-contrast · 0.45mm/px · z∈[-115,+15]mm · 8 of 34 slices shown, 10 images]
[im 4/34  brain]
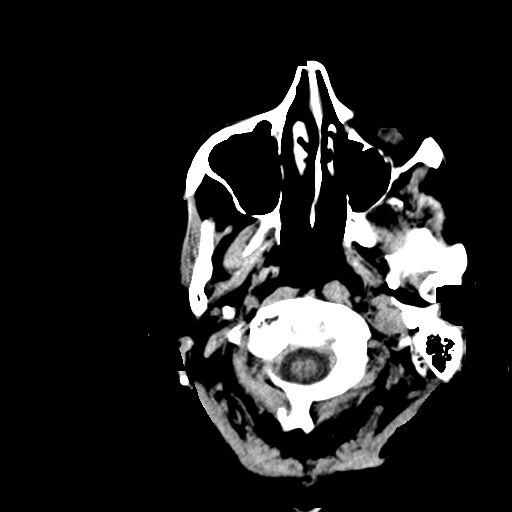
[im 4/34  bone]
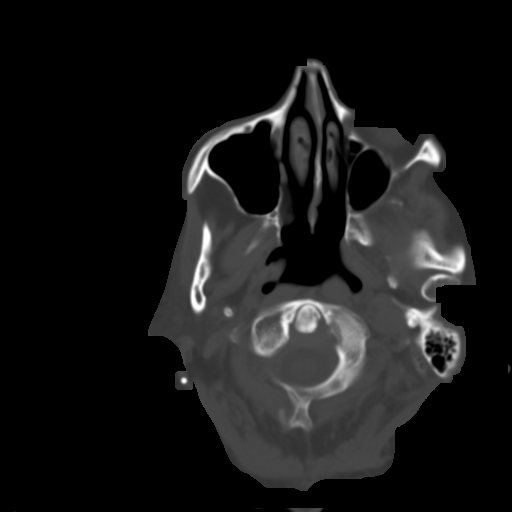
[im 7/34  brain]
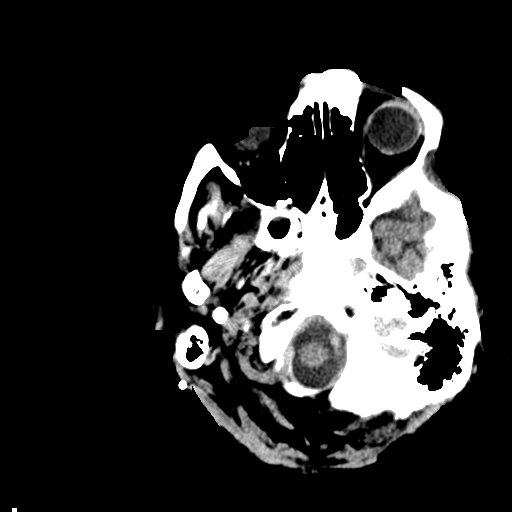
[im 10/34  brain]
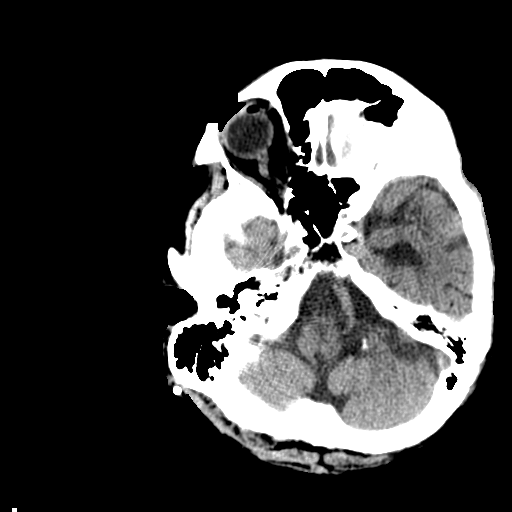
[im 14/34  brain]
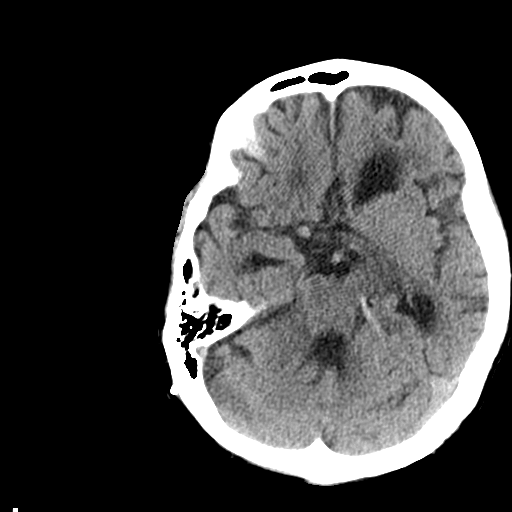
[im 20/34  brain]
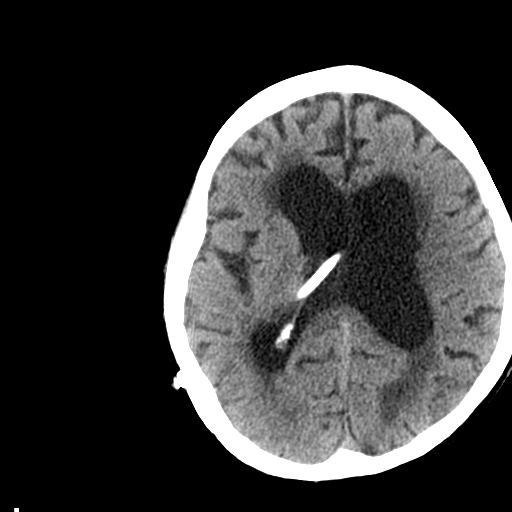
[im 20/34  bone]
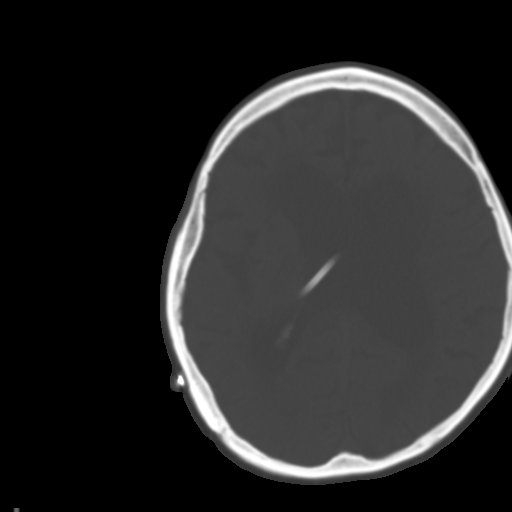
[im 24/34  brain]
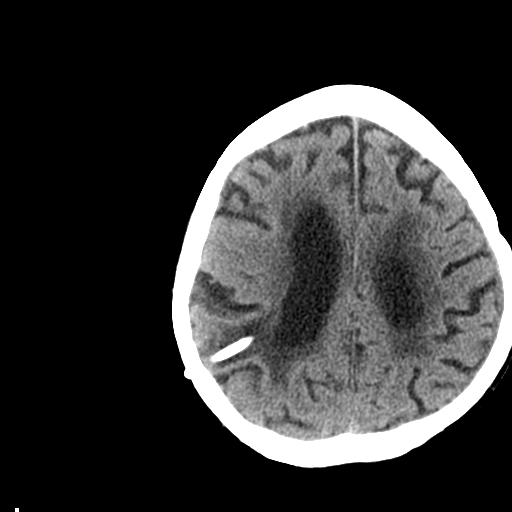
[im 27/34  brain]
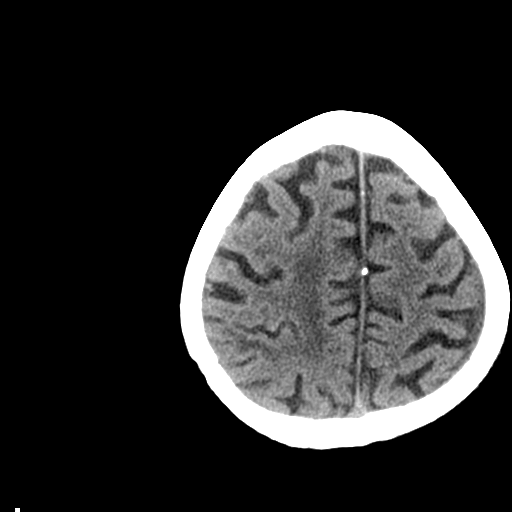
[im 30/34  brain]
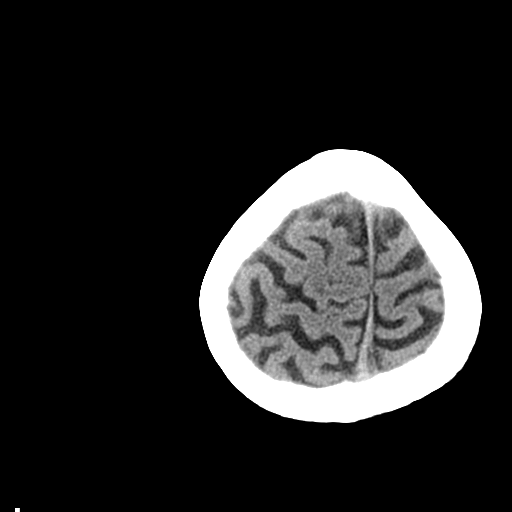

[Series 4: coronal · coronal · 0.38mm/px · 3 of 70 slices shown]
[im 24/70  brain]
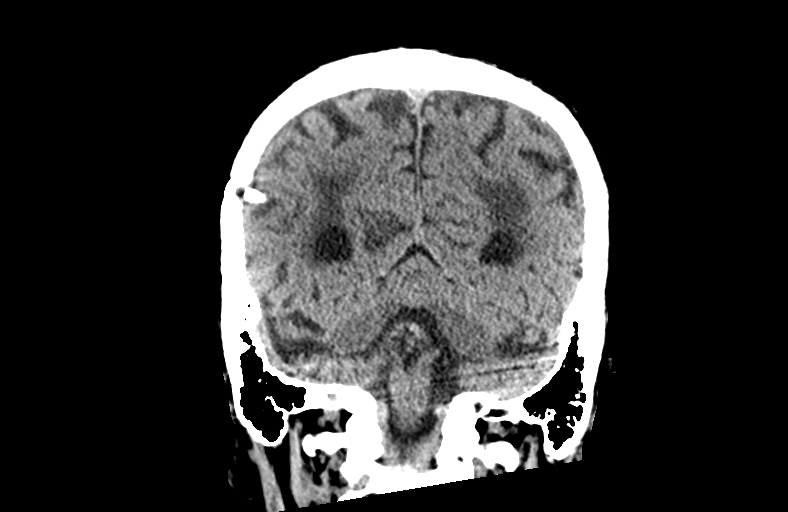
[im 31/70  brain]
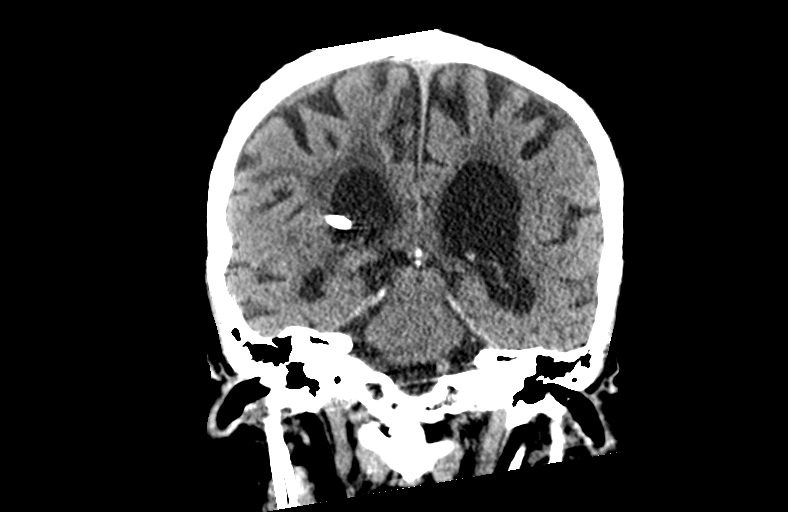
[im 39/70  brain]
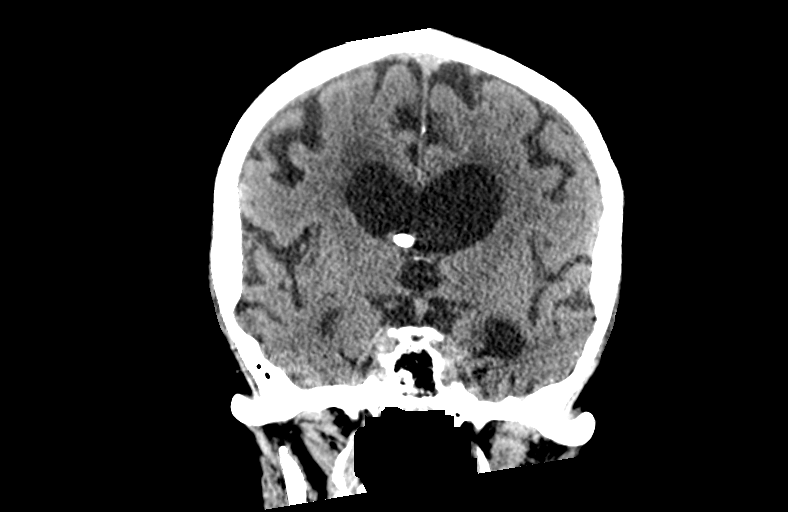

[Series 5: sagittal · sagittal · 0.37mm/px · 3 of 62 slices shown]
[im 25/62  brain]
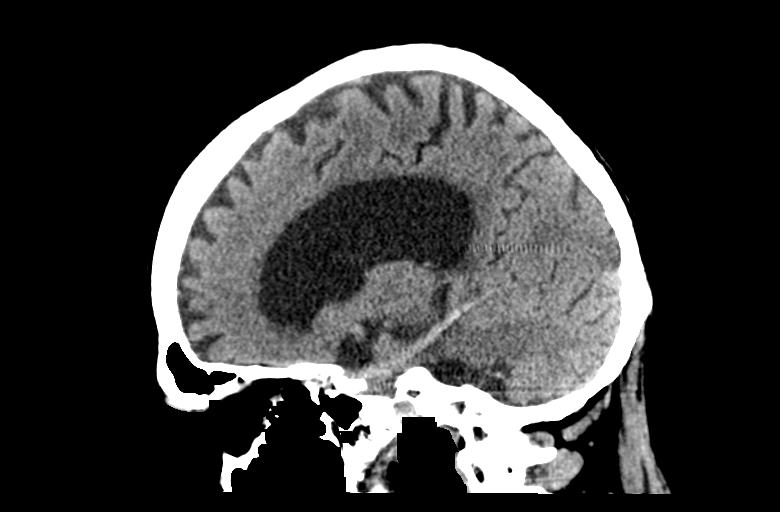
[im 31/62  brain]
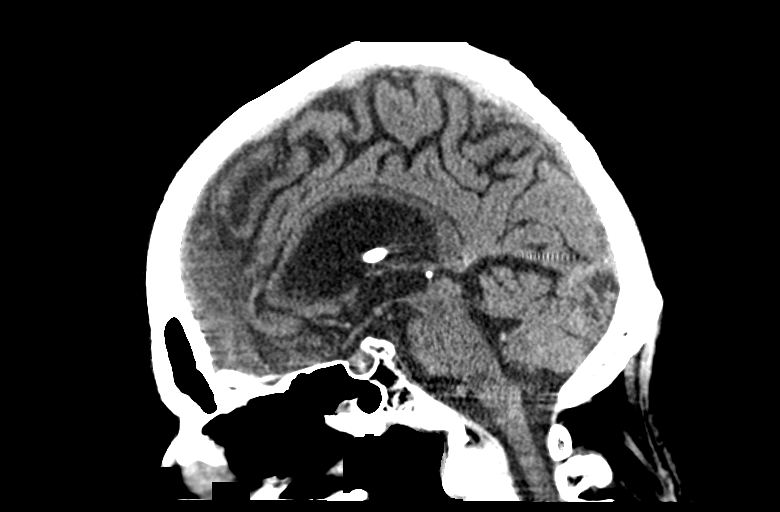
[im 38/62  brain]
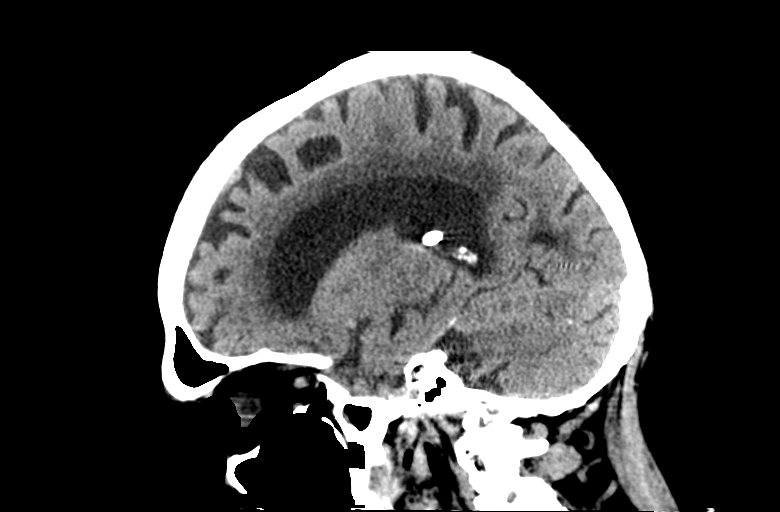

[14 of 47 positions shown; findings below may reference images not displayed]

FINDINGS: Brain: There is a right parietal approach shunt catheter with tip in
the frontal horn of the left lateral ventricle. Compared to the
examination of 07/31/2017, there is increased hydrocephalus, most
notable in the left lateral ventricle and third ventricle. The
frontal horn and temporal horn of the left lateral ventricle have
increased in size. Previously seen right convexity subdural hematoma
has resolved. There is a small amount of residual chronic subdural
blood over the left convexity, measuring 3 mm in thickness. The
degree of periventricular hypoattenuation is unchanged.

Vascular: Atherosclerotic calcification of the internal carotid
arteries at the skull base.

Skull: Normal visualized skull base, calvarium and extracranial soft
tissues.

Sinuses/Orbits: No sinus fluid levels or advanced mucosal
thickening. No mastoid effusion. Normal orbits.
IMPRESSION: 1. Increased hydrocephalus, most notable in the left lateral and
third ventricles, is concerning for shunt failure.
2. No acute hemorrhage. Resolution of previously seen right subdural
hematoma. Minimal chronic residual left subdural blood.

## 2018-01-28 DIAGNOSIS — I50813 Acute on chronic right heart failure: Secondary | ICD-10-CM | POA: Diagnosis not present

## 2018-01-28 DIAGNOSIS — M1990 Unspecified osteoarthritis, unspecified site: Secondary | ICD-10-CM | POA: Diagnosis not present

## 2018-01-28 DIAGNOSIS — F0391 Unspecified dementia with behavioral disturbance: Secondary | ICD-10-CM | POA: Diagnosis not present

## 2018-01-28 DIAGNOSIS — M5136 Other intervertebral disc degeneration, lumbar region: Secondary | ICD-10-CM | POA: Diagnosis not present

## 2018-01-28 DIAGNOSIS — M47816 Spondylosis without myelopathy or radiculopathy, lumbar region: Secondary | ICD-10-CM | POA: Diagnosis not present

## 2018-01-28 DIAGNOSIS — G912 (Idiopathic) normal pressure hydrocephalus: Secondary | ICD-10-CM | POA: Diagnosis not present

## 2018-01-30 DIAGNOSIS — M5136 Other intervertebral disc degeneration, lumbar region: Secondary | ICD-10-CM | POA: Diagnosis not present

## 2018-01-30 DIAGNOSIS — G912 (Idiopathic) normal pressure hydrocephalus: Secondary | ICD-10-CM | POA: Diagnosis not present

## 2018-01-30 DIAGNOSIS — F0391 Unspecified dementia with behavioral disturbance: Secondary | ICD-10-CM | POA: Diagnosis not present

## 2018-01-30 DIAGNOSIS — I50813 Acute on chronic right heart failure: Secondary | ICD-10-CM | POA: Diagnosis not present

## 2018-01-30 DIAGNOSIS — M1990 Unspecified osteoarthritis, unspecified site: Secondary | ICD-10-CM | POA: Diagnosis not present

## 2018-01-30 DIAGNOSIS — M47816 Spondylosis without myelopathy or radiculopathy, lumbar region: Secondary | ICD-10-CM | POA: Diagnosis not present

## 2018-02-04 DIAGNOSIS — I50813 Acute on chronic right heart failure: Secondary | ICD-10-CM | POA: Diagnosis not present

## 2018-02-04 DIAGNOSIS — M1990 Unspecified osteoarthritis, unspecified site: Secondary | ICD-10-CM | POA: Diagnosis not present

## 2018-02-04 DIAGNOSIS — G912 (Idiopathic) normal pressure hydrocephalus: Secondary | ICD-10-CM | POA: Diagnosis not present

## 2018-02-04 DIAGNOSIS — F0391 Unspecified dementia with behavioral disturbance: Secondary | ICD-10-CM | POA: Diagnosis not present

## 2018-02-04 DIAGNOSIS — M47816 Spondylosis without myelopathy or radiculopathy, lumbar region: Secondary | ICD-10-CM | POA: Diagnosis not present

## 2018-02-04 DIAGNOSIS — M5136 Other intervertebral disc degeneration, lumbar region: Secondary | ICD-10-CM | POA: Diagnosis not present

## 2018-02-05 DIAGNOSIS — R8290 Unspecified abnormal findings in urine: Secondary | ICD-10-CM | POA: Diagnosis not present

## 2018-02-05 DIAGNOSIS — N390 Urinary tract infection, site not specified: Secondary | ICD-10-CM | POA: Diagnosis not present

## 2018-02-06 DIAGNOSIS — M1990 Unspecified osteoarthritis, unspecified site: Secondary | ICD-10-CM | POA: Diagnosis not present

## 2018-02-06 DIAGNOSIS — G912 (Idiopathic) normal pressure hydrocephalus: Secondary | ICD-10-CM | POA: Diagnosis not present

## 2018-02-06 DIAGNOSIS — M47816 Spondylosis without myelopathy or radiculopathy, lumbar region: Secondary | ICD-10-CM | POA: Diagnosis not present

## 2018-02-06 DIAGNOSIS — F0391 Unspecified dementia with behavioral disturbance: Secondary | ICD-10-CM | POA: Diagnosis not present

## 2018-02-06 DIAGNOSIS — M5136 Other intervertebral disc degeneration, lumbar region: Secondary | ICD-10-CM | POA: Diagnosis not present

## 2018-02-06 DIAGNOSIS — I50813 Acute on chronic right heart failure: Secondary | ICD-10-CM | POA: Diagnosis not present

## 2018-02-11 DIAGNOSIS — F0391 Unspecified dementia with behavioral disturbance: Secondary | ICD-10-CM | POA: Diagnosis not present

## 2018-02-11 DIAGNOSIS — M1990 Unspecified osteoarthritis, unspecified site: Secondary | ICD-10-CM | POA: Diagnosis not present

## 2018-02-11 DIAGNOSIS — G912 (Idiopathic) normal pressure hydrocephalus: Secondary | ICD-10-CM | POA: Diagnosis not present

## 2018-02-11 DIAGNOSIS — M5136 Other intervertebral disc degeneration, lumbar region: Secondary | ICD-10-CM | POA: Diagnosis not present

## 2018-02-11 DIAGNOSIS — M47816 Spondylosis without myelopathy or radiculopathy, lumbar region: Secondary | ICD-10-CM | POA: Diagnosis not present

## 2018-02-11 DIAGNOSIS — I50813 Acute on chronic right heart failure: Secondary | ICD-10-CM | POA: Diagnosis not present

## 2018-02-14 DIAGNOSIS — I50813 Acute on chronic right heart failure: Secondary | ICD-10-CM | POA: Diagnosis not present

## 2018-02-14 DIAGNOSIS — G912 (Idiopathic) normal pressure hydrocephalus: Secondary | ICD-10-CM | POA: Diagnosis not present

## 2018-02-14 DIAGNOSIS — F0391 Unspecified dementia with behavioral disturbance: Secondary | ICD-10-CM | POA: Diagnosis not present

## 2018-02-14 DIAGNOSIS — M5136 Other intervertebral disc degeneration, lumbar region: Secondary | ICD-10-CM | POA: Diagnosis not present

## 2018-02-14 DIAGNOSIS — M47816 Spondylosis without myelopathy or radiculopathy, lumbar region: Secondary | ICD-10-CM | POA: Diagnosis not present

## 2018-02-14 DIAGNOSIS — M1990 Unspecified osteoarthritis, unspecified site: Secondary | ICD-10-CM | POA: Diagnosis not present

## 2018-06-22 DIAGNOSIS — F0280 Dementia in other diseases classified elsewhere without behavioral disturbance: Secondary | ICD-10-CM | POA: Diagnosis not present

## 2018-06-22 DIAGNOSIS — R634 Abnormal weight loss: Secondary | ICD-10-CM | POA: Diagnosis not present

## 2018-06-22 DIAGNOSIS — F339 Major depressive disorder, recurrent, unspecified: Secondary | ICD-10-CM | POA: Diagnosis not present

## 2018-06-22 DIAGNOSIS — M810 Age-related osteoporosis without current pathological fracture: Secondary | ICD-10-CM | POA: Diagnosis not present

## 2018-06-22 DIAGNOSIS — N401 Enlarged prostate with lower urinary tract symptoms: Secondary | ICD-10-CM | POA: Diagnosis not present

## 2018-06-22 DIAGNOSIS — H81399 Other peripheral vertigo, unspecified ear: Secondary | ICD-10-CM | POA: Diagnosis not present

## 2018-06-22 DIAGNOSIS — R630 Anorexia: Secondary | ICD-10-CM | POA: Diagnosis not present

## 2018-06-22 DIAGNOSIS — G912 (Idiopathic) normal pressure hydrocephalus: Secondary | ICD-10-CM | POA: Diagnosis not present

## 2018-06-22 DIAGNOSIS — G309 Alzheimer's disease, unspecified: Secondary | ICD-10-CM | POA: Diagnosis not present

## 2018-06-24 DIAGNOSIS — G912 (Idiopathic) normal pressure hydrocephalus: Secondary | ICD-10-CM | POA: Diagnosis not present

## 2018-06-24 DIAGNOSIS — F0280 Dementia in other diseases classified elsewhere without behavioral disturbance: Secondary | ICD-10-CM | POA: Diagnosis not present

## 2018-06-24 DIAGNOSIS — F339 Major depressive disorder, recurrent, unspecified: Secondary | ICD-10-CM | POA: Diagnosis not present

## 2018-06-24 DIAGNOSIS — R630 Anorexia: Secondary | ICD-10-CM | POA: Diagnosis not present

## 2018-06-24 DIAGNOSIS — G309 Alzheimer's disease, unspecified: Secondary | ICD-10-CM | POA: Diagnosis not present

## 2018-06-24 DIAGNOSIS — R634 Abnormal weight loss: Secondary | ICD-10-CM | POA: Diagnosis not present

## 2018-06-25 DIAGNOSIS — G912 (Idiopathic) normal pressure hydrocephalus: Secondary | ICD-10-CM | POA: Diagnosis not present

## 2018-06-25 DIAGNOSIS — R634 Abnormal weight loss: Secondary | ICD-10-CM | POA: Diagnosis not present

## 2018-06-25 DIAGNOSIS — F0280 Dementia in other diseases classified elsewhere without behavioral disturbance: Secondary | ICD-10-CM | POA: Diagnosis not present

## 2018-06-25 DIAGNOSIS — R630 Anorexia: Secondary | ICD-10-CM | POA: Diagnosis not present

## 2018-06-25 DIAGNOSIS — G309 Alzheimer's disease, unspecified: Secondary | ICD-10-CM | POA: Diagnosis not present

## 2018-06-25 DIAGNOSIS — F339 Major depressive disorder, recurrent, unspecified: Secondary | ICD-10-CM | POA: Diagnosis not present

## 2018-06-27 DIAGNOSIS — F339 Major depressive disorder, recurrent, unspecified: Secondary | ICD-10-CM | POA: Diagnosis not present

## 2018-06-27 DIAGNOSIS — M810 Age-related osteoporosis without current pathological fracture: Secondary | ICD-10-CM | POA: Diagnosis not present

## 2018-06-27 DIAGNOSIS — G912 (Idiopathic) normal pressure hydrocephalus: Secondary | ICD-10-CM | POA: Diagnosis not present

## 2018-06-27 DIAGNOSIS — N401 Enlarged prostate with lower urinary tract symptoms: Secondary | ICD-10-CM | POA: Diagnosis not present

## 2018-06-27 DIAGNOSIS — F0280 Dementia in other diseases classified elsewhere without behavioral disturbance: Secondary | ICD-10-CM | POA: Diagnosis not present

## 2018-06-27 DIAGNOSIS — G309 Alzheimer's disease, unspecified: Secondary | ICD-10-CM | POA: Diagnosis not present

## 2018-06-27 DIAGNOSIS — R630 Anorexia: Secondary | ICD-10-CM | POA: Diagnosis not present

## 2018-06-27 DIAGNOSIS — R634 Abnormal weight loss: Secondary | ICD-10-CM | POA: Diagnosis not present

## 2018-06-27 DIAGNOSIS — H81399 Other peripheral vertigo, unspecified ear: Secondary | ICD-10-CM | POA: Diagnosis not present

## 2018-07-01 DIAGNOSIS — F339 Major depressive disorder, recurrent, unspecified: Secondary | ICD-10-CM | POA: Diagnosis not present

## 2018-07-01 DIAGNOSIS — R630 Anorexia: Secondary | ICD-10-CM | POA: Diagnosis not present

## 2018-07-01 DIAGNOSIS — R634 Abnormal weight loss: Secondary | ICD-10-CM | POA: Diagnosis not present

## 2018-07-01 DIAGNOSIS — G309 Alzheimer's disease, unspecified: Secondary | ICD-10-CM | POA: Diagnosis not present

## 2018-07-01 DIAGNOSIS — G912 (Idiopathic) normal pressure hydrocephalus: Secondary | ICD-10-CM | POA: Diagnosis not present

## 2018-07-01 DIAGNOSIS — F0280 Dementia in other diseases classified elsewhere without behavioral disturbance: Secondary | ICD-10-CM | POA: Diagnosis not present

## 2018-07-04 DIAGNOSIS — R630 Anorexia: Secondary | ICD-10-CM | POA: Diagnosis not present

## 2018-07-04 DIAGNOSIS — F339 Major depressive disorder, recurrent, unspecified: Secondary | ICD-10-CM | POA: Diagnosis not present

## 2018-07-04 DIAGNOSIS — R634 Abnormal weight loss: Secondary | ICD-10-CM | POA: Diagnosis not present

## 2018-07-04 DIAGNOSIS — F0280 Dementia in other diseases classified elsewhere without behavioral disturbance: Secondary | ICD-10-CM | POA: Diagnosis not present

## 2018-07-04 DIAGNOSIS — G912 (Idiopathic) normal pressure hydrocephalus: Secondary | ICD-10-CM | POA: Diagnosis not present

## 2018-07-04 DIAGNOSIS — G309 Alzheimer's disease, unspecified: Secondary | ICD-10-CM | POA: Diagnosis not present

## 2018-07-09 DIAGNOSIS — G912 (Idiopathic) normal pressure hydrocephalus: Secondary | ICD-10-CM | POA: Diagnosis not present

## 2018-07-09 DIAGNOSIS — G309 Alzheimer's disease, unspecified: Secondary | ICD-10-CM | POA: Diagnosis not present

## 2018-07-09 DIAGNOSIS — F339 Major depressive disorder, recurrent, unspecified: Secondary | ICD-10-CM | POA: Diagnosis not present

## 2018-07-09 DIAGNOSIS — F0280 Dementia in other diseases classified elsewhere without behavioral disturbance: Secondary | ICD-10-CM | POA: Diagnosis not present

## 2018-07-09 DIAGNOSIS — R630 Anorexia: Secondary | ICD-10-CM | POA: Diagnosis not present

## 2018-07-09 DIAGNOSIS — R634 Abnormal weight loss: Secondary | ICD-10-CM | POA: Diagnosis not present

## 2018-07-12 DIAGNOSIS — F339 Major depressive disorder, recurrent, unspecified: Secondary | ICD-10-CM | POA: Diagnosis not present

## 2018-07-12 DIAGNOSIS — F0280 Dementia in other diseases classified elsewhere without behavioral disturbance: Secondary | ICD-10-CM | POA: Diagnosis not present

## 2018-07-12 DIAGNOSIS — R630 Anorexia: Secondary | ICD-10-CM | POA: Diagnosis not present

## 2018-07-12 DIAGNOSIS — G309 Alzheimer's disease, unspecified: Secondary | ICD-10-CM | POA: Diagnosis not present

## 2018-07-12 DIAGNOSIS — G912 (Idiopathic) normal pressure hydrocephalus: Secondary | ICD-10-CM | POA: Diagnosis not present

## 2018-07-12 DIAGNOSIS — R634 Abnormal weight loss: Secondary | ICD-10-CM | POA: Diagnosis not present

## 2018-07-16 DIAGNOSIS — F339 Major depressive disorder, recurrent, unspecified: Secondary | ICD-10-CM | POA: Diagnosis not present

## 2018-07-16 DIAGNOSIS — R630 Anorexia: Secondary | ICD-10-CM | POA: Diagnosis not present

## 2018-07-16 DIAGNOSIS — R634 Abnormal weight loss: Secondary | ICD-10-CM | POA: Diagnosis not present

## 2018-07-16 DIAGNOSIS — G309 Alzheimer's disease, unspecified: Secondary | ICD-10-CM | POA: Diagnosis not present

## 2018-07-16 DIAGNOSIS — F0280 Dementia in other diseases classified elsewhere without behavioral disturbance: Secondary | ICD-10-CM | POA: Diagnosis not present

## 2018-07-16 DIAGNOSIS — G912 (Idiopathic) normal pressure hydrocephalus: Secondary | ICD-10-CM | POA: Diagnosis not present

## 2018-07-17 DIAGNOSIS — R630 Anorexia: Secondary | ICD-10-CM | POA: Diagnosis not present

## 2018-07-17 DIAGNOSIS — F0280 Dementia in other diseases classified elsewhere without behavioral disturbance: Secondary | ICD-10-CM | POA: Diagnosis not present

## 2018-07-17 DIAGNOSIS — G309 Alzheimer's disease, unspecified: Secondary | ICD-10-CM | POA: Diagnosis not present

## 2018-07-17 DIAGNOSIS — R634 Abnormal weight loss: Secondary | ICD-10-CM | POA: Diagnosis not present

## 2018-07-17 DIAGNOSIS — G912 (Idiopathic) normal pressure hydrocephalus: Secondary | ICD-10-CM | POA: Diagnosis not present

## 2018-07-17 DIAGNOSIS — F339 Major depressive disorder, recurrent, unspecified: Secondary | ICD-10-CM | POA: Diagnosis not present

## 2018-07-23 DIAGNOSIS — G912 (Idiopathic) normal pressure hydrocephalus: Secondary | ICD-10-CM | POA: Diagnosis not present

## 2018-07-23 DIAGNOSIS — G309 Alzheimer's disease, unspecified: Secondary | ICD-10-CM | POA: Diagnosis not present

## 2018-07-23 DIAGNOSIS — F0280 Dementia in other diseases classified elsewhere without behavioral disturbance: Secondary | ICD-10-CM | POA: Diagnosis not present

## 2018-07-23 DIAGNOSIS — F339 Major depressive disorder, recurrent, unspecified: Secondary | ICD-10-CM | POA: Diagnosis not present

## 2018-07-23 DIAGNOSIS — R634 Abnormal weight loss: Secondary | ICD-10-CM | POA: Diagnosis not present

## 2018-07-23 DIAGNOSIS — R630 Anorexia: Secondary | ICD-10-CM | POA: Diagnosis not present

## 2018-07-28 DIAGNOSIS — G912 (Idiopathic) normal pressure hydrocephalus: Secondary | ICD-10-CM | POA: Diagnosis not present

## 2018-07-28 DIAGNOSIS — R630 Anorexia: Secondary | ICD-10-CM | POA: Diagnosis not present

## 2018-07-28 DIAGNOSIS — F0280 Dementia in other diseases classified elsewhere without behavioral disturbance: Secondary | ICD-10-CM | POA: Diagnosis not present

## 2018-07-28 DIAGNOSIS — G309 Alzheimer's disease, unspecified: Secondary | ICD-10-CM | POA: Diagnosis not present

## 2018-07-28 DIAGNOSIS — F339 Major depressive disorder, recurrent, unspecified: Secondary | ICD-10-CM | POA: Diagnosis not present

## 2018-07-28 DIAGNOSIS — N401 Enlarged prostate with lower urinary tract symptoms: Secondary | ICD-10-CM | POA: Diagnosis not present

## 2018-07-28 DIAGNOSIS — M810 Age-related osteoporosis without current pathological fracture: Secondary | ICD-10-CM | POA: Diagnosis not present

## 2018-07-28 DIAGNOSIS — H81399 Other peripheral vertigo, unspecified ear: Secondary | ICD-10-CM | POA: Diagnosis not present

## 2018-07-28 DIAGNOSIS — R634 Abnormal weight loss: Secondary | ICD-10-CM | POA: Diagnosis not present

## 2018-07-31 DIAGNOSIS — G912 (Idiopathic) normal pressure hydrocephalus: Secondary | ICD-10-CM | POA: Diagnosis not present

## 2018-07-31 DIAGNOSIS — R630 Anorexia: Secondary | ICD-10-CM | POA: Diagnosis not present

## 2018-07-31 DIAGNOSIS — R634 Abnormal weight loss: Secondary | ICD-10-CM | POA: Diagnosis not present

## 2018-07-31 DIAGNOSIS — G309 Alzheimer's disease, unspecified: Secondary | ICD-10-CM | POA: Diagnosis not present

## 2018-07-31 DIAGNOSIS — F339 Major depressive disorder, recurrent, unspecified: Secondary | ICD-10-CM | POA: Diagnosis not present

## 2018-07-31 DIAGNOSIS — F0280 Dementia in other diseases classified elsewhere without behavioral disturbance: Secondary | ICD-10-CM | POA: Diagnosis not present

## 2018-08-06 DIAGNOSIS — G309 Alzheimer's disease, unspecified: Secondary | ICD-10-CM | POA: Diagnosis not present

## 2018-08-06 DIAGNOSIS — G912 (Idiopathic) normal pressure hydrocephalus: Secondary | ICD-10-CM | POA: Diagnosis not present

## 2018-08-06 DIAGNOSIS — R634 Abnormal weight loss: Secondary | ICD-10-CM | POA: Diagnosis not present

## 2018-08-06 DIAGNOSIS — R630 Anorexia: Secondary | ICD-10-CM | POA: Diagnosis not present

## 2018-08-06 DIAGNOSIS — F339 Major depressive disorder, recurrent, unspecified: Secondary | ICD-10-CM | POA: Diagnosis not present

## 2018-08-06 DIAGNOSIS — F0280 Dementia in other diseases classified elsewhere without behavioral disturbance: Secondary | ICD-10-CM | POA: Diagnosis not present

## 2018-08-13 DIAGNOSIS — R630 Anorexia: Secondary | ICD-10-CM | POA: Diagnosis not present

## 2018-08-13 DIAGNOSIS — F0280 Dementia in other diseases classified elsewhere without behavioral disturbance: Secondary | ICD-10-CM | POA: Diagnosis not present

## 2018-08-13 DIAGNOSIS — G309 Alzheimer's disease, unspecified: Secondary | ICD-10-CM | POA: Diagnosis not present

## 2018-08-13 DIAGNOSIS — G912 (Idiopathic) normal pressure hydrocephalus: Secondary | ICD-10-CM | POA: Diagnosis not present

## 2018-08-13 DIAGNOSIS — F339 Major depressive disorder, recurrent, unspecified: Secondary | ICD-10-CM | POA: Diagnosis not present

## 2018-08-13 DIAGNOSIS — R634 Abnormal weight loss: Secondary | ICD-10-CM | POA: Diagnosis not present

## 2018-08-15 DIAGNOSIS — F339 Major depressive disorder, recurrent, unspecified: Secondary | ICD-10-CM | POA: Diagnosis not present

## 2018-08-15 DIAGNOSIS — R634 Abnormal weight loss: Secondary | ICD-10-CM | POA: Diagnosis not present

## 2018-08-15 DIAGNOSIS — F0280 Dementia in other diseases classified elsewhere without behavioral disturbance: Secondary | ICD-10-CM | POA: Diagnosis not present

## 2018-08-15 DIAGNOSIS — G309 Alzheimer's disease, unspecified: Secondary | ICD-10-CM | POA: Diagnosis not present

## 2018-08-15 DIAGNOSIS — R630 Anorexia: Secondary | ICD-10-CM | POA: Diagnosis not present

## 2018-08-15 DIAGNOSIS — G912 (Idiopathic) normal pressure hydrocephalus: Secondary | ICD-10-CM | POA: Diagnosis not present

## 2018-08-20 DIAGNOSIS — F339 Major depressive disorder, recurrent, unspecified: Secondary | ICD-10-CM | POA: Diagnosis not present

## 2018-08-20 DIAGNOSIS — G309 Alzheimer's disease, unspecified: Secondary | ICD-10-CM | POA: Diagnosis not present

## 2018-08-20 DIAGNOSIS — R634 Abnormal weight loss: Secondary | ICD-10-CM | POA: Diagnosis not present

## 2018-08-20 DIAGNOSIS — F0280 Dementia in other diseases classified elsewhere without behavioral disturbance: Secondary | ICD-10-CM | POA: Diagnosis not present

## 2018-08-20 DIAGNOSIS — R630 Anorexia: Secondary | ICD-10-CM | POA: Diagnosis not present

## 2018-08-20 DIAGNOSIS — G912 (Idiopathic) normal pressure hydrocephalus: Secondary | ICD-10-CM | POA: Diagnosis not present

## 2018-08-27 DIAGNOSIS — F0280 Dementia in other diseases classified elsewhere without behavioral disturbance: Secondary | ICD-10-CM | POA: Diagnosis not present

## 2018-08-27 DIAGNOSIS — M810 Age-related osteoporosis without current pathological fracture: Secondary | ICD-10-CM | POA: Diagnosis not present

## 2018-08-27 DIAGNOSIS — G912 (Idiopathic) normal pressure hydrocephalus: Secondary | ICD-10-CM | POA: Diagnosis not present

## 2018-08-27 DIAGNOSIS — N401 Enlarged prostate with lower urinary tract symptoms: Secondary | ICD-10-CM | POA: Diagnosis not present

## 2018-08-27 DIAGNOSIS — G309 Alzheimer's disease, unspecified: Secondary | ICD-10-CM | POA: Diagnosis not present

## 2018-08-27 DIAGNOSIS — R630 Anorexia: Secondary | ICD-10-CM | POA: Diagnosis not present

## 2018-08-27 DIAGNOSIS — R634 Abnormal weight loss: Secondary | ICD-10-CM | POA: Diagnosis not present

## 2018-08-27 DIAGNOSIS — F339 Major depressive disorder, recurrent, unspecified: Secondary | ICD-10-CM | POA: Diagnosis not present

## 2018-08-27 DIAGNOSIS — H81399 Other peripheral vertigo, unspecified ear: Secondary | ICD-10-CM | POA: Diagnosis not present

## 2018-09-04 DIAGNOSIS — G309 Alzheimer's disease, unspecified: Secondary | ICD-10-CM | POA: Diagnosis not present

## 2018-09-04 DIAGNOSIS — F0280 Dementia in other diseases classified elsewhere without behavioral disturbance: Secondary | ICD-10-CM | POA: Diagnosis not present

## 2018-09-04 DIAGNOSIS — F339 Major depressive disorder, recurrent, unspecified: Secondary | ICD-10-CM | POA: Diagnosis not present

## 2018-09-04 DIAGNOSIS — G912 (Idiopathic) normal pressure hydrocephalus: Secondary | ICD-10-CM | POA: Diagnosis not present

## 2018-09-04 DIAGNOSIS — R634 Abnormal weight loss: Secondary | ICD-10-CM | POA: Diagnosis not present

## 2018-09-04 DIAGNOSIS — R630 Anorexia: Secondary | ICD-10-CM | POA: Diagnosis not present

## 2018-09-10 DIAGNOSIS — F0280 Dementia in other diseases classified elsewhere without behavioral disturbance: Secondary | ICD-10-CM | POA: Diagnosis not present

## 2018-09-10 DIAGNOSIS — G912 (Idiopathic) normal pressure hydrocephalus: Secondary | ICD-10-CM | POA: Diagnosis not present

## 2018-09-10 DIAGNOSIS — G309 Alzheimer's disease, unspecified: Secondary | ICD-10-CM | POA: Diagnosis not present

## 2018-09-10 DIAGNOSIS — F339 Major depressive disorder, recurrent, unspecified: Secondary | ICD-10-CM | POA: Diagnosis not present

## 2018-09-10 DIAGNOSIS — R630 Anorexia: Secondary | ICD-10-CM | POA: Diagnosis not present

## 2018-09-10 DIAGNOSIS — R634 Abnormal weight loss: Secondary | ICD-10-CM | POA: Diagnosis not present

## 2018-09-13 DIAGNOSIS — F0280 Dementia in other diseases classified elsewhere without behavioral disturbance: Secondary | ICD-10-CM | POA: Diagnosis not present

## 2018-09-13 DIAGNOSIS — R630 Anorexia: Secondary | ICD-10-CM | POA: Diagnosis not present

## 2018-09-13 DIAGNOSIS — F339 Major depressive disorder, recurrent, unspecified: Secondary | ICD-10-CM | POA: Diagnosis not present

## 2018-09-13 DIAGNOSIS — R634 Abnormal weight loss: Secondary | ICD-10-CM | POA: Diagnosis not present

## 2018-09-13 DIAGNOSIS — G309 Alzheimer's disease, unspecified: Secondary | ICD-10-CM | POA: Diagnosis not present

## 2018-09-13 DIAGNOSIS — G912 (Idiopathic) normal pressure hydrocephalus: Secondary | ICD-10-CM | POA: Diagnosis not present

## 2018-09-17 DIAGNOSIS — R630 Anorexia: Secondary | ICD-10-CM | POA: Diagnosis not present

## 2018-09-17 DIAGNOSIS — G309 Alzheimer's disease, unspecified: Secondary | ICD-10-CM | POA: Diagnosis not present

## 2018-09-17 DIAGNOSIS — R634 Abnormal weight loss: Secondary | ICD-10-CM | POA: Diagnosis not present

## 2018-09-17 DIAGNOSIS — F339 Major depressive disorder, recurrent, unspecified: Secondary | ICD-10-CM | POA: Diagnosis not present

## 2018-09-17 DIAGNOSIS — G912 (Idiopathic) normal pressure hydrocephalus: Secondary | ICD-10-CM | POA: Diagnosis not present

## 2018-09-17 DIAGNOSIS — F0280 Dementia in other diseases classified elsewhere without behavioral disturbance: Secondary | ICD-10-CM | POA: Diagnosis not present

## 2018-09-24 DIAGNOSIS — R634 Abnormal weight loss: Secondary | ICD-10-CM | POA: Diagnosis not present

## 2018-09-24 DIAGNOSIS — G912 (Idiopathic) normal pressure hydrocephalus: Secondary | ICD-10-CM | POA: Diagnosis not present

## 2018-09-24 DIAGNOSIS — F0280 Dementia in other diseases classified elsewhere without behavioral disturbance: Secondary | ICD-10-CM | POA: Diagnosis not present

## 2018-09-24 DIAGNOSIS — R630 Anorexia: Secondary | ICD-10-CM | POA: Diagnosis not present

## 2018-09-24 DIAGNOSIS — G309 Alzheimer's disease, unspecified: Secondary | ICD-10-CM | POA: Diagnosis not present

## 2018-09-24 DIAGNOSIS — F339 Major depressive disorder, recurrent, unspecified: Secondary | ICD-10-CM | POA: Diagnosis not present

## 2018-09-27 DIAGNOSIS — G912 (Idiopathic) normal pressure hydrocephalus: Secondary | ICD-10-CM | POA: Diagnosis not present

## 2018-09-27 DIAGNOSIS — H81399 Other peripheral vertigo, unspecified ear: Secondary | ICD-10-CM | POA: Diagnosis not present

## 2018-09-27 DIAGNOSIS — R634 Abnormal weight loss: Secondary | ICD-10-CM | POA: Diagnosis not present

## 2018-09-27 DIAGNOSIS — M810 Age-related osteoporosis without current pathological fracture: Secondary | ICD-10-CM | POA: Diagnosis not present

## 2018-09-27 DIAGNOSIS — R630 Anorexia: Secondary | ICD-10-CM | POA: Diagnosis not present

## 2018-09-27 DIAGNOSIS — G309 Alzheimer's disease, unspecified: Secondary | ICD-10-CM | POA: Diagnosis not present

## 2018-09-27 DIAGNOSIS — F339 Major depressive disorder, recurrent, unspecified: Secondary | ICD-10-CM | POA: Diagnosis not present

## 2018-09-27 DIAGNOSIS — N401 Enlarged prostate with lower urinary tract symptoms: Secondary | ICD-10-CM | POA: Diagnosis not present

## 2018-09-27 DIAGNOSIS — F0280 Dementia in other diseases classified elsewhere without behavioral disturbance: Secondary | ICD-10-CM | POA: Diagnosis not present

## 2018-10-02 DIAGNOSIS — G912 (Idiopathic) normal pressure hydrocephalus: Secondary | ICD-10-CM | POA: Diagnosis not present

## 2018-10-02 DIAGNOSIS — R634 Abnormal weight loss: Secondary | ICD-10-CM | POA: Diagnosis not present

## 2018-10-02 DIAGNOSIS — F0280 Dementia in other diseases classified elsewhere without behavioral disturbance: Secondary | ICD-10-CM | POA: Diagnosis not present

## 2018-10-02 DIAGNOSIS — G309 Alzheimer's disease, unspecified: Secondary | ICD-10-CM | POA: Diagnosis not present

## 2018-10-02 DIAGNOSIS — R630 Anorexia: Secondary | ICD-10-CM | POA: Diagnosis not present

## 2018-10-02 DIAGNOSIS — F339 Major depressive disorder, recurrent, unspecified: Secondary | ICD-10-CM | POA: Diagnosis not present

## 2018-10-09 DIAGNOSIS — R634 Abnormal weight loss: Secondary | ICD-10-CM | POA: Diagnosis not present

## 2018-10-09 DIAGNOSIS — F339 Major depressive disorder, recurrent, unspecified: Secondary | ICD-10-CM | POA: Diagnosis not present

## 2018-10-09 DIAGNOSIS — R630 Anorexia: Secondary | ICD-10-CM | POA: Diagnosis not present

## 2018-10-09 DIAGNOSIS — G309 Alzheimer's disease, unspecified: Secondary | ICD-10-CM | POA: Diagnosis not present

## 2018-10-09 DIAGNOSIS — F0280 Dementia in other diseases classified elsewhere without behavioral disturbance: Secondary | ICD-10-CM | POA: Diagnosis not present

## 2018-10-09 DIAGNOSIS — G912 (Idiopathic) normal pressure hydrocephalus: Secondary | ICD-10-CM | POA: Diagnosis not present

## 2018-10-16 DIAGNOSIS — G912 (Idiopathic) normal pressure hydrocephalus: Secondary | ICD-10-CM | POA: Diagnosis not present

## 2018-10-16 DIAGNOSIS — R630 Anorexia: Secondary | ICD-10-CM | POA: Diagnosis not present

## 2018-10-16 DIAGNOSIS — R634 Abnormal weight loss: Secondary | ICD-10-CM | POA: Diagnosis not present

## 2018-10-16 DIAGNOSIS — G309 Alzheimer's disease, unspecified: Secondary | ICD-10-CM | POA: Diagnosis not present

## 2018-10-16 DIAGNOSIS — F339 Major depressive disorder, recurrent, unspecified: Secondary | ICD-10-CM | POA: Diagnosis not present

## 2018-10-16 DIAGNOSIS — F0280 Dementia in other diseases classified elsewhere without behavioral disturbance: Secondary | ICD-10-CM | POA: Diagnosis not present

## 2018-10-22 DIAGNOSIS — G309 Alzheimer's disease, unspecified: Secondary | ICD-10-CM | POA: Diagnosis not present

## 2018-10-22 DIAGNOSIS — F339 Major depressive disorder, recurrent, unspecified: Secondary | ICD-10-CM | POA: Diagnosis not present

## 2018-10-22 DIAGNOSIS — R630 Anorexia: Secondary | ICD-10-CM | POA: Diagnosis not present

## 2018-10-22 DIAGNOSIS — R634 Abnormal weight loss: Secondary | ICD-10-CM | POA: Diagnosis not present

## 2018-10-22 DIAGNOSIS — F0280 Dementia in other diseases classified elsewhere without behavioral disturbance: Secondary | ICD-10-CM | POA: Diagnosis not present

## 2018-10-22 DIAGNOSIS — G912 (Idiopathic) normal pressure hydrocephalus: Secondary | ICD-10-CM | POA: Diagnosis not present

## 2018-10-23 DIAGNOSIS — G309 Alzheimer's disease, unspecified: Secondary | ICD-10-CM | POA: Diagnosis not present

## 2018-10-23 DIAGNOSIS — R634 Abnormal weight loss: Secondary | ICD-10-CM | POA: Diagnosis not present

## 2018-10-23 DIAGNOSIS — F0280 Dementia in other diseases classified elsewhere without behavioral disturbance: Secondary | ICD-10-CM | POA: Diagnosis not present

## 2018-10-23 DIAGNOSIS — F339 Major depressive disorder, recurrent, unspecified: Secondary | ICD-10-CM | POA: Diagnosis not present

## 2018-10-23 DIAGNOSIS — G912 (Idiopathic) normal pressure hydrocephalus: Secondary | ICD-10-CM | POA: Diagnosis not present

## 2018-10-23 DIAGNOSIS — R630 Anorexia: Secondary | ICD-10-CM | POA: Diagnosis not present

## 2018-10-27 DIAGNOSIS — N401 Enlarged prostate with lower urinary tract symptoms: Secondary | ICD-10-CM | POA: Diagnosis not present

## 2018-10-27 DIAGNOSIS — M810 Age-related osteoporosis without current pathological fracture: Secondary | ICD-10-CM | POA: Diagnosis not present

## 2018-10-27 DIAGNOSIS — G309 Alzheimer's disease, unspecified: Secondary | ICD-10-CM | POA: Diagnosis not present

## 2018-10-27 DIAGNOSIS — F0280 Dementia in other diseases classified elsewhere without behavioral disturbance: Secondary | ICD-10-CM | POA: Diagnosis not present

## 2018-10-27 DIAGNOSIS — R634 Abnormal weight loss: Secondary | ICD-10-CM | POA: Diagnosis not present

## 2018-10-27 DIAGNOSIS — R630 Anorexia: Secondary | ICD-10-CM | POA: Diagnosis not present

## 2018-10-27 DIAGNOSIS — F339 Major depressive disorder, recurrent, unspecified: Secondary | ICD-10-CM | POA: Diagnosis not present

## 2018-10-27 DIAGNOSIS — H81399 Other peripheral vertigo, unspecified ear: Secondary | ICD-10-CM | POA: Diagnosis not present

## 2018-10-27 DIAGNOSIS — G912 (Idiopathic) normal pressure hydrocephalus: Secondary | ICD-10-CM | POA: Diagnosis not present

## 2018-10-31 DIAGNOSIS — F0280 Dementia in other diseases classified elsewhere without behavioral disturbance: Secondary | ICD-10-CM | POA: Diagnosis not present

## 2018-10-31 DIAGNOSIS — G912 (Idiopathic) normal pressure hydrocephalus: Secondary | ICD-10-CM | POA: Diagnosis not present

## 2018-10-31 DIAGNOSIS — R634 Abnormal weight loss: Secondary | ICD-10-CM | POA: Diagnosis not present

## 2018-10-31 DIAGNOSIS — G309 Alzheimer's disease, unspecified: Secondary | ICD-10-CM | POA: Diagnosis not present

## 2018-10-31 DIAGNOSIS — R630 Anorexia: Secondary | ICD-10-CM | POA: Diagnosis not present

## 2018-10-31 DIAGNOSIS — F339 Major depressive disorder, recurrent, unspecified: Secondary | ICD-10-CM | POA: Diagnosis not present

## 2018-11-07 DIAGNOSIS — G309 Alzheimer's disease, unspecified: Secondary | ICD-10-CM | POA: Diagnosis not present

## 2018-11-07 DIAGNOSIS — R634 Abnormal weight loss: Secondary | ICD-10-CM | POA: Diagnosis not present

## 2018-11-07 DIAGNOSIS — F339 Major depressive disorder, recurrent, unspecified: Secondary | ICD-10-CM | POA: Diagnosis not present

## 2018-11-07 DIAGNOSIS — G912 (Idiopathic) normal pressure hydrocephalus: Secondary | ICD-10-CM | POA: Diagnosis not present

## 2018-11-07 DIAGNOSIS — F0280 Dementia in other diseases classified elsewhere without behavioral disturbance: Secondary | ICD-10-CM | POA: Diagnosis not present

## 2018-11-07 DIAGNOSIS — R630 Anorexia: Secondary | ICD-10-CM | POA: Diagnosis not present

## 2018-11-08 DIAGNOSIS — F0280 Dementia in other diseases classified elsewhere without behavioral disturbance: Secondary | ICD-10-CM | POA: Diagnosis not present

## 2018-11-08 DIAGNOSIS — F339 Major depressive disorder, recurrent, unspecified: Secondary | ICD-10-CM | POA: Diagnosis not present

## 2018-11-08 DIAGNOSIS — G912 (Idiopathic) normal pressure hydrocephalus: Secondary | ICD-10-CM | POA: Diagnosis not present

## 2018-11-08 DIAGNOSIS — R634 Abnormal weight loss: Secondary | ICD-10-CM | POA: Diagnosis not present

## 2018-11-08 DIAGNOSIS — R630 Anorexia: Secondary | ICD-10-CM | POA: Diagnosis not present

## 2018-11-08 DIAGNOSIS — G309 Alzheimer's disease, unspecified: Secondary | ICD-10-CM | POA: Diagnosis not present

## 2018-11-15 DIAGNOSIS — F339 Major depressive disorder, recurrent, unspecified: Secondary | ICD-10-CM | POA: Diagnosis not present

## 2018-11-15 DIAGNOSIS — R634 Abnormal weight loss: Secondary | ICD-10-CM | POA: Diagnosis not present

## 2018-11-15 DIAGNOSIS — G309 Alzheimer's disease, unspecified: Secondary | ICD-10-CM | POA: Diagnosis not present

## 2018-11-15 DIAGNOSIS — R630 Anorexia: Secondary | ICD-10-CM | POA: Diagnosis not present

## 2018-11-15 DIAGNOSIS — F0280 Dementia in other diseases classified elsewhere without behavioral disturbance: Secondary | ICD-10-CM | POA: Diagnosis not present

## 2018-11-15 DIAGNOSIS — G912 (Idiopathic) normal pressure hydrocephalus: Secondary | ICD-10-CM | POA: Diagnosis not present

## 2018-11-21 DIAGNOSIS — G912 (Idiopathic) normal pressure hydrocephalus: Secondary | ICD-10-CM | POA: Diagnosis not present

## 2018-11-21 DIAGNOSIS — R634 Abnormal weight loss: Secondary | ICD-10-CM | POA: Diagnosis not present

## 2018-11-21 DIAGNOSIS — F0280 Dementia in other diseases classified elsewhere without behavioral disturbance: Secondary | ICD-10-CM | POA: Diagnosis not present

## 2018-11-21 DIAGNOSIS — R630 Anorexia: Secondary | ICD-10-CM | POA: Diagnosis not present

## 2018-11-21 DIAGNOSIS — F339 Major depressive disorder, recurrent, unspecified: Secondary | ICD-10-CM | POA: Diagnosis not present

## 2018-11-21 DIAGNOSIS — G309 Alzheimer's disease, unspecified: Secondary | ICD-10-CM | POA: Diagnosis not present

## 2018-11-26 DIAGNOSIS — G309 Alzheimer's disease, unspecified: Secondary | ICD-10-CM | POA: Diagnosis not present

## 2018-11-26 DIAGNOSIS — R634 Abnormal weight loss: Secondary | ICD-10-CM | POA: Diagnosis not present

## 2018-11-26 DIAGNOSIS — F339 Major depressive disorder, recurrent, unspecified: Secondary | ICD-10-CM | POA: Diagnosis not present

## 2018-11-26 DIAGNOSIS — R630 Anorexia: Secondary | ICD-10-CM | POA: Diagnosis not present

## 2018-11-26 DIAGNOSIS — F0280 Dementia in other diseases classified elsewhere without behavioral disturbance: Secondary | ICD-10-CM | POA: Diagnosis not present

## 2018-11-26 DIAGNOSIS — G912 (Idiopathic) normal pressure hydrocephalus: Secondary | ICD-10-CM | POA: Diagnosis not present

## 2018-11-27 DIAGNOSIS — G912 (Idiopathic) normal pressure hydrocephalus: Secondary | ICD-10-CM | POA: Diagnosis not present

## 2018-11-27 DIAGNOSIS — M810 Age-related osteoporosis without current pathological fracture: Secondary | ICD-10-CM | POA: Diagnosis not present

## 2018-11-27 DIAGNOSIS — F339 Major depressive disorder, recurrent, unspecified: Secondary | ICD-10-CM | POA: Diagnosis not present

## 2018-11-27 DIAGNOSIS — H81399 Other peripheral vertigo, unspecified ear: Secondary | ICD-10-CM | POA: Diagnosis not present

## 2018-11-27 DIAGNOSIS — N401 Enlarged prostate with lower urinary tract symptoms: Secondary | ICD-10-CM | POA: Diagnosis not present

## 2018-11-27 DIAGNOSIS — R630 Anorexia: Secondary | ICD-10-CM | POA: Diagnosis not present

## 2018-11-27 DIAGNOSIS — R634 Abnormal weight loss: Secondary | ICD-10-CM | POA: Diagnosis not present

## 2018-11-27 DIAGNOSIS — G309 Alzheimer's disease, unspecified: Secondary | ICD-10-CM | POA: Diagnosis not present

## 2018-11-27 DIAGNOSIS — F0280 Dementia in other diseases classified elsewhere without behavioral disturbance: Secondary | ICD-10-CM | POA: Diagnosis not present

## 2018-11-28 DIAGNOSIS — F339 Major depressive disorder, recurrent, unspecified: Secondary | ICD-10-CM | POA: Diagnosis not present

## 2018-11-28 DIAGNOSIS — F0280 Dementia in other diseases classified elsewhere without behavioral disturbance: Secondary | ICD-10-CM | POA: Diagnosis not present

## 2018-11-28 DIAGNOSIS — G912 (Idiopathic) normal pressure hydrocephalus: Secondary | ICD-10-CM | POA: Diagnosis not present

## 2018-11-28 DIAGNOSIS — R634 Abnormal weight loss: Secondary | ICD-10-CM | POA: Diagnosis not present

## 2018-11-28 DIAGNOSIS — R630 Anorexia: Secondary | ICD-10-CM | POA: Diagnosis not present

## 2018-11-28 DIAGNOSIS — G309 Alzheimer's disease, unspecified: Secondary | ICD-10-CM | POA: Diagnosis not present

## 2018-12-05 DIAGNOSIS — F0280 Dementia in other diseases classified elsewhere without behavioral disturbance: Secondary | ICD-10-CM | POA: Diagnosis not present

## 2018-12-05 DIAGNOSIS — R634 Abnormal weight loss: Secondary | ICD-10-CM | POA: Diagnosis not present

## 2018-12-05 DIAGNOSIS — G309 Alzheimer's disease, unspecified: Secondary | ICD-10-CM | POA: Diagnosis not present

## 2018-12-05 DIAGNOSIS — F339 Major depressive disorder, recurrent, unspecified: Secondary | ICD-10-CM | POA: Diagnosis not present

## 2018-12-05 DIAGNOSIS — R630 Anorexia: Secondary | ICD-10-CM | POA: Diagnosis not present

## 2018-12-05 DIAGNOSIS — G912 (Idiopathic) normal pressure hydrocephalus: Secondary | ICD-10-CM | POA: Diagnosis not present

## 2018-12-06 DIAGNOSIS — F339 Major depressive disorder, recurrent, unspecified: Secondary | ICD-10-CM | POA: Diagnosis not present

## 2018-12-06 DIAGNOSIS — G912 (Idiopathic) normal pressure hydrocephalus: Secondary | ICD-10-CM | POA: Diagnosis not present

## 2018-12-06 DIAGNOSIS — R634 Abnormal weight loss: Secondary | ICD-10-CM | POA: Diagnosis not present

## 2018-12-06 DIAGNOSIS — G309 Alzheimer's disease, unspecified: Secondary | ICD-10-CM | POA: Diagnosis not present

## 2018-12-06 DIAGNOSIS — R630 Anorexia: Secondary | ICD-10-CM | POA: Diagnosis not present

## 2018-12-06 DIAGNOSIS — F0280 Dementia in other diseases classified elsewhere without behavioral disturbance: Secondary | ICD-10-CM | POA: Diagnosis not present

## 2018-12-12 DIAGNOSIS — G309 Alzheimer's disease, unspecified: Secondary | ICD-10-CM | POA: Diagnosis not present

## 2018-12-12 DIAGNOSIS — G912 (Idiopathic) normal pressure hydrocephalus: Secondary | ICD-10-CM | POA: Diagnosis not present

## 2018-12-12 DIAGNOSIS — R634 Abnormal weight loss: Secondary | ICD-10-CM | POA: Diagnosis not present

## 2018-12-12 DIAGNOSIS — R630 Anorexia: Secondary | ICD-10-CM | POA: Diagnosis not present

## 2018-12-12 DIAGNOSIS — F339 Major depressive disorder, recurrent, unspecified: Secondary | ICD-10-CM | POA: Diagnosis not present

## 2018-12-12 DIAGNOSIS — F0280 Dementia in other diseases classified elsewhere without behavioral disturbance: Secondary | ICD-10-CM | POA: Diagnosis not present

## 2018-12-16 DIAGNOSIS — G309 Alzheimer's disease, unspecified: Secondary | ICD-10-CM | POA: Diagnosis not present

## 2018-12-16 DIAGNOSIS — F339 Major depressive disorder, recurrent, unspecified: Secondary | ICD-10-CM | POA: Diagnosis not present

## 2018-12-16 DIAGNOSIS — F0280 Dementia in other diseases classified elsewhere without behavioral disturbance: Secondary | ICD-10-CM | POA: Diagnosis not present

## 2018-12-16 DIAGNOSIS — R634 Abnormal weight loss: Secondary | ICD-10-CM | POA: Diagnosis not present

## 2018-12-16 DIAGNOSIS — G912 (Idiopathic) normal pressure hydrocephalus: Secondary | ICD-10-CM | POA: Diagnosis not present

## 2018-12-16 DIAGNOSIS — R630 Anorexia: Secondary | ICD-10-CM | POA: Diagnosis not present

## 2018-12-19 DIAGNOSIS — R634 Abnormal weight loss: Secondary | ICD-10-CM | POA: Diagnosis not present

## 2018-12-19 DIAGNOSIS — G912 (Idiopathic) normal pressure hydrocephalus: Secondary | ICD-10-CM | POA: Diagnosis not present

## 2018-12-19 DIAGNOSIS — F339 Major depressive disorder, recurrent, unspecified: Secondary | ICD-10-CM | POA: Diagnosis not present

## 2018-12-19 DIAGNOSIS — F0280 Dementia in other diseases classified elsewhere without behavioral disturbance: Secondary | ICD-10-CM | POA: Diagnosis not present

## 2018-12-19 DIAGNOSIS — R630 Anorexia: Secondary | ICD-10-CM | POA: Diagnosis not present

## 2018-12-19 DIAGNOSIS — G309 Alzheimer's disease, unspecified: Secondary | ICD-10-CM | POA: Diagnosis not present

## 2018-12-26 DIAGNOSIS — R634 Abnormal weight loss: Secondary | ICD-10-CM | POA: Diagnosis not present

## 2018-12-26 DIAGNOSIS — R630 Anorexia: Secondary | ICD-10-CM | POA: Diagnosis not present

## 2018-12-26 DIAGNOSIS — F339 Major depressive disorder, recurrent, unspecified: Secondary | ICD-10-CM | POA: Diagnosis not present

## 2018-12-26 DIAGNOSIS — F0280 Dementia in other diseases classified elsewhere without behavioral disturbance: Secondary | ICD-10-CM | POA: Diagnosis not present

## 2018-12-26 DIAGNOSIS — G912 (Idiopathic) normal pressure hydrocephalus: Secondary | ICD-10-CM | POA: Diagnosis not present

## 2018-12-26 DIAGNOSIS — G309 Alzheimer's disease, unspecified: Secondary | ICD-10-CM | POA: Diagnosis not present

## 2018-12-28 DIAGNOSIS — G912 (Idiopathic) normal pressure hydrocephalus: Secondary | ICD-10-CM | POA: Diagnosis not present

## 2018-12-28 DIAGNOSIS — M810 Age-related osteoporosis without current pathological fracture: Secondary | ICD-10-CM | POA: Diagnosis not present

## 2018-12-28 DIAGNOSIS — H81399 Other peripheral vertigo, unspecified ear: Secondary | ICD-10-CM | POA: Diagnosis not present

## 2018-12-28 DIAGNOSIS — R634 Abnormal weight loss: Secondary | ICD-10-CM | POA: Diagnosis not present

## 2018-12-28 DIAGNOSIS — F339 Major depressive disorder, recurrent, unspecified: Secondary | ICD-10-CM | POA: Diagnosis not present

## 2018-12-28 DIAGNOSIS — R630 Anorexia: Secondary | ICD-10-CM | POA: Diagnosis not present

## 2018-12-28 DIAGNOSIS — G309 Alzheimer's disease, unspecified: Secondary | ICD-10-CM | POA: Diagnosis not present

## 2018-12-28 DIAGNOSIS — F0280 Dementia in other diseases classified elsewhere without behavioral disturbance: Secondary | ICD-10-CM | POA: Diagnosis not present

## 2018-12-28 DIAGNOSIS — N401 Enlarged prostate with lower urinary tract symptoms: Secondary | ICD-10-CM | POA: Diagnosis not present

## 2018-12-31 DIAGNOSIS — F0280 Dementia in other diseases classified elsewhere without behavioral disturbance: Secondary | ICD-10-CM | POA: Diagnosis not present

## 2018-12-31 DIAGNOSIS — G912 (Idiopathic) normal pressure hydrocephalus: Secondary | ICD-10-CM | POA: Diagnosis not present

## 2018-12-31 DIAGNOSIS — G309 Alzheimer's disease, unspecified: Secondary | ICD-10-CM | POA: Diagnosis not present

## 2018-12-31 DIAGNOSIS — R634 Abnormal weight loss: Secondary | ICD-10-CM | POA: Diagnosis not present

## 2018-12-31 DIAGNOSIS — F339 Major depressive disorder, recurrent, unspecified: Secondary | ICD-10-CM | POA: Diagnosis not present

## 2018-12-31 DIAGNOSIS — R630 Anorexia: Secondary | ICD-10-CM | POA: Diagnosis not present

## 2019-01-02 DIAGNOSIS — G309 Alzheimer's disease, unspecified: Secondary | ICD-10-CM | POA: Diagnosis not present

## 2019-01-02 DIAGNOSIS — R634 Abnormal weight loss: Secondary | ICD-10-CM | POA: Diagnosis not present

## 2019-01-02 DIAGNOSIS — F339 Major depressive disorder, recurrent, unspecified: Secondary | ICD-10-CM | POA: Diagnosis not present

## 2019-01-02 DIAGNOSIS — G912 (Idiopathic) normal pressure hydrocephalus: Secondary | ICD-10-CM | POA: Diagnosis not present

## 2019-01-02 DIAGNOSIS — F0280 Dementia in other diseases classified elsewhere without behavioral disturbance: Secondary | ICD-10-CM | POA: Diagnosis not present

## 2019-01-02 DIAGNOSIS — R630 Anorexia: Secondary | ICD-10-CM | POA: Diagnosis not present

## 2019-01-03 DIAGNOSIS — G309 Alzheimer's disease, unspecified: Secondary | ICD-10-CM | POA: Diagnosis not present

## 2019-01-03 DIAGNOSIS — G912 (Idiopathic) normal pressure hydrocephalus: Secondary | ICD-10-CM | POA: Diagnosis not present

## 2019-01-03 DIAGNOSIS — F0280 Dementia in other diseases classified elsewhere without behavioral disturbance: Secondary | ICD-10-CM | POA: Diagnosis not present

## 2019-01-03 DIAGNOSIS — R634 Abnormal weight loss: Secondary | ICD-10-CM | POA: Diagnosis not present

## 2019-01-03 DIAGNOSIS — R630 Anorexia: Secondary | ICD-10-CM | POA: Diagnosis not present

## 2019-01-03 DIAGNOSIS — F339 Major depressive disorder, recurrent, unspecified: Secondary | ICD-10-CM | POA: Diagnosis not present

## 2019-01-04 DIAGNOSIS — R630 Anorexia: Secondary | ICD-10-CM | POA: Diagnosis not present

## 2019-01-04 DIAGNOSIS — R634 Abnormal weight loss: Secondary | ICD-10-CM | POA: Diagnosis not present

## 2019-01-04 DIAGNOSIS — G912 (Idiopathic) normal pressure hydrocephalus: Secondary | ICD-10-CM | POA: Diagnosis not present

## 2019-01-04 DIAGNOSIS — F0280 Dementia in other diseases classified elsewhere without behavioral disturbance: Secondary | ICD-10-CM | POA: Diagnosis not present

## 2019-01-04 DIAGNOSIS — G309 Alzheimer's disease, unspecified: Secondary | ICD-10-CM | POA: Diagnosis not present

## 2019-01-04 DIAGNOSIS — F339 Major depressive disorder, recurrent, unspecified: Secondary | ICD-10-CM | POA: Diagnosis not present

## 2019-01-05 DIAGNOSIS — G912 (Idiopathic) normal pressure hydrocephalus: Secondary | ICD-10-CM | POA: Diagnosis not present

## 2019-01-05 DIAGNOSIS — G309 Alzheimer's disease, unspecified: Secondary | ICD-10-CM | POA: Diagnosis not present

## 2019-01-05 DIAGNOSIS — R634 Abnormal weight loss: Secondary | ICD-10-CM | POA: Diagnosis not present

## 2019-01-05 DIAGNOSIS — R630 Anorexia: Secondary | ICD-10-CM | POA: Diagnosis not present

## 2019-01-05 DIAGNOSIS — F339 Major depressive disorder, recurrent, unspecified: Secondary | ICD-10-CM | POA: Diagnosis not present

## 2019-01-05 DIAGNOSIS — F0280 Dementia in other diseases classified elsewhere without behavioral disturbance: Secondary | ICD-10-CM | POA: Diagnosis not present

## 2019-01-07 DIAGNOSIS — R630 Anorexia: Secondary | ICD-10-CM | POA: Diagnosis not present

## 2019-01-07 DIAGNOSIS — F0280 Dementia in other diseases classified elsewhere without behavioral disturbance: Secondary | ICD-10-CM | POA: Diagnosis not present

## 2019-01-07 DIAGNOSIS — F339 Major depressive disorder, recurrent, unspecified: Secondary | ICD-10-CM | POA: Diagnosis not present

## 2019-01-07 DIAGNOSIS — R634 Abnormal weight loss: Secondary | ICD-10-CM | POA: Diagnosis not present

## 2019-01-07 DIAGNOSIS — G912 (Idiopathic) normal pressure hydrocephalus: Secondary | ICD-10-CM | POA: Diagnosis not present

## 2019-01-07 DIAGNOSIS — G309 Alzheimer's disease, unspecified: Secondary | ICD-10-CM | POA: Diagnosis not present

## 2019-01-09 DIAGNOSIS — R630 Anorexia: Secondary | ICD-10-CM | POA: Diagnosis not present

## 2019-01-09 DIAGNOSIS — R634 Abnormal weight loss: Secondary | ICD-10-CM | POA: Diagnosis not present

## 2019-01-09 DIAGNOSIS — F339 Major depressive disorder, recurrent, unspecified: Secondary | ICD-10-CM | POA: Diagnosis not present

## 2019-01-09 DIAGNOSIS — G912 (Idiopathic) normal pressure hydrocephalus: Secondary | ICD-10-CM | POA: Diagnosis not present

## 2019-01-09 DIAGNOSIS — G309 Alzheimer's disease, unspecified: Secondary | ICD-10-CM | POA: Diagnosis not present

## 2019-01-09 DIAGNOSIS — F0280 Dementia in other diseases classified elsewhere without behavioral disturbance: Secondary | ICD-10-CM | POA: Diagnosis not present

## 2019-01-10 DIAGNOSIS — R634 Abnormal weight loss: Secondary | ICD-10-CM | POA: Diagnosis not present

## 2019-01-10 DIAGNOSIS — G309 Alzheimer's disease, unspecified: Secondary | ICD-10-CM | POA: Diagnosis not present

## 2019-01-10 DIAGNOSIS — F0280 Dementia in other diseases classified elsewhere without behavioral disturbance: Secondary | ICD-10-CM | POA: Diagnosis not present

## 2019-01-10 DIAGNOSIS — R630 Anorexia: Secondary | ICD-10-CM | POA: Diagnosis not present

## 2019-01-10 DIAGNOSIS — G912 (Idiopathic) normal pressure hydrocephalus: Secondary | ICD-10-CM | POA: Diagnosis not present

## 2019-01-10 DIAGNOSIS — F339 Major depressive disorder, recurrent, unspecified: Secondary | ICD-10-CM | POA: Diagnosis not present

## 2019-01-14 DIAGNOSIS — G309 Alzheimer's disease, unspecified: Secondary | ICD-10-CM | POA: Diagnosis not present

## 2019-01-14 DIAGNOSIS — R630 Anorexia: Secondary | ICD-10-CM | POA: Diagnosis not present

## 2019-01-14 DIAGNOSIS — F0280 Dementia in other diseases classified elsewhere without behavioral disturbance: Secondary | ICD-10-CM | POA: Diagnosis not present

## 2019-01-14 DIAGNOSIS — G912 (Idiopathic) normal pressure hydrocephalus: Secondary | ICD-10-CM | POA: Diagnosis not present

## 2019-01-14 DIAGNOSIS — F339 Major depressive disorder, recurrent, unspecified: Secondary | ICD-10-CM | POA: Diagnosis not present

## 2019-01-14 DIAGNOSIS — R634 Abnormal weight loss: Secondary | ICD-10-CM | POA: Diagnosis not present

## 2019-01-17 DIAGNOSIS — F339 Major depressive disorder, recurrent, unspecified: Secondary | ICD-10-CM | POA: Diagnosis not present

## 2019-01-17 DIAGNOSIS — G309 Alzheimer's disease, unspecified: Secondary | ICD-10-CM | POA: Diagnosis not present

## 2019-01-17 DIAGNOSIS — G912 (Idiopathic) normal pressure hydrocephalus: Secondary | ICD-10-CM | POA: Diagnosis not present

## 2019-01-17 DIAGNOSIS — R630 Anorexia: Secondary | ICD-10-CM | POA: Diagnosis not present

## 2019-01-17 DIAGNOSIS — F0280 Dementia in other diseases classified elsewhere without behavioral disturbance: Secondary | ICD-10-CM | POA: Diagnosis not present

## 2019-01-17 DIAGNOSIS — R634 Abnormal weight loss: Secondary | ICD-10-CM | POA: Diagnosis not present

## 2019-01-19 DIAGNOSIS — R634 Abnormal weight loss: Secondary | ICD-10-CM | POA: Diagnosis not present

## 2019-01-19 DIAGNOSIS — R630 Anorexia: Secondary | ICD-10-CM | POA: Diagnosis not present

## 2019-01-19 DIAGNOSIS — G912 (Idiopathic) normal pressure hydrocephalus: Secondary | ICD-10-CM | POA: Diagnosis not present

## 2019-01-19 DIAGNOSIS — G309 Alzheimer's disease, unspecified: Secondary | ICD-10-CM | POA: Diagnosis not present

## 2019-01-19 DIAGNOSIS — F0280 Dementia in other diseases classified elsewhere without behavioral disturbance: Secondary | ICD-10-CM | POA: Diagnosis not present

## 2019-01-19 DIAGNOSIS — F339 Major depressive disorder, recurrent, unspecified: Secondary | ICD-10-CM | POA: Diagnosis not present

## 2019-01-20 DIAGNOSIS — R634 Abnormal weight loss: Secondary | ICD-10-CM | POA: Diagnosis not present

## 2019-01-20 DIAGNOSIS — F0280 Dementia in other diseases classified elsewhere without behavioral disturbance: Secondary | ICD-10-CM | POA: Diagnosis not present

## 2019-01-20 DIAGNOSIS — G912 (Idiopathic) normal pressure hydrocephalus: Secondary | ICD-10-CM | POA: Diagnosis not present

## 2019-01-20 DIAGNOSIS — F339 Major depressive disorder, recurrent, unspecified: Secondary | ICD-10-CM | POA: Diagnosis not present

## 2019-01-20 DIAGNOSIS — R630 Anorexia: Secondary | ICD-10-CM | POA: Diagnosis not present

## 2019-01-20 DIAGNOSIS — G309 Alzheimer's disease, unspecified: Secondary | ICD-10-CM | POA: Diagnosis not present

## 2019-01-26 DEATH — deceased
